# Patient Record
Sex: Male | Born: 1955 | Race: White | Hispanic: No | Marital: Married | Smoking: Never smoker
Health system: Southern US, Community
[De-identification: ages and names within clinical notes are randomized; demographics above are authoritative.]

## PROBLEM LIST (undated history)

## (undated) DIAGNOSIS — I2510 Atherosclerotic heart disease of native coronary artery without angina pectoris: Secondary | ICD-10-CM

## (undated) DIAGNOSIS — E1140 Type 2 diabetes mellitus with diabetic neuropathy, unspecified: Secondary | ICD-10-CM

## (undated) DIAGNOSIS — J189 Pneumonia, unspecified organism: Secondary | ICD-10-CM

## (undated) DIAGNOSIS — E119 Type 2 diabetes mellitus without complications: Secondary | ICD-10-CM

## (undated) DIAGNOSIS — I509 Heart failure, unspecified: Secondary | ICD-10-CM

## (undated) DIAGNOSIS — I219 Acute myocardial infarction, unspecified: Secondary | ICD-10-CM

## (undated) DIAGNOSIS — E1121 Type 2 diabetes mellitus with diabetic nephropathy: Secondary | ICD-10-CM

## (undated) DIAGNOSIS — I10 Essential (primary) hypertension: Secondary | ICD-10-CM

## (undated) DIAGNOSIS — E1110 Type 2 diabetes mellitus with ketoacidosis without coma: Secondary | ICD-10-CM

## (undated) HISTORY — PX: KNEE ARTHROSCOPY: SHX127

## (undated) HISTORY — PX: APPENDECTOMY: SHX54

## (undated) HISTORY — PX: BACK SURGERY: SHX140

## (undated) HISTORY — PX: LUMBAR DISC SURGERY: SHX700

---

## 2000-02-20 HISTORY — PX: CORONARY ARTERY BYPASS GRAFT: SHX141

## 2013-02-19 HISTORY — PX: CORONARY ANGIOPLASTY WITH STENT PLACEMENT: SHX49

## 2015-01-20 HISTORY — PX: TOE AMPUTATION: SHX809

## 2017-10-20 DIAGNOSIS — E1110 Type 2 diabetes mellitus with ketoacidosis without coma: Secondary | ICD-10-CM

## 2017-10-20 HISTORY — DX: Type 2 diabetes mellitus with ketoacidosis without coma: E11.10

## 2017-11-07 ENCOUNTER — Other Ambulatory Visit: Payer: Self-pay

## 2017-11-07 ENCOUNTER — Encounter (HOSPITAL_BASED_OUTPATIENT_CLINIC_OR_DEPARTMENT_OTHER): Payer: Self-pay | Admitting: *Deleted

## 2017-11-07 ENCOUNTER — Inpatient Hospital Stay (HOSPITAL_COMMUNITY): Payer: BLUE CROSS/BLUE SHIELD

## 2017-11-07 ENCOUNTER — Emergency Department (HOSPITAL_BASED_OUTPATIENT_CLINIC_OR_DEPARTMENT_OTHER): Payer: BLUE CROSS/BLUE SHIELD

## 2017-11-07 ENCOUNTER — Inpatient Hospital Stay (HOSPITAL_BASED_OUTPATIENT_CLINIC_OR_DEPARTMENT_OTHER)
Admission: EM | Admit: 2017-11-07 | Discharge: 2017-11-16 | DRG: 208 | Disposition: A | Discharge status: Home / Self Care | Payer: BLUE CROSS/BLUE SHIELD | Attending: Internal Medicine | Admitting: Internal Medicine

## 2017-11-07 DIAGNOSIS — I340 Nonrheumatic mitral (valve) insufficiency: Secondary | ICD-10-CM | POA: Diagnosis not present

## 2017-11-07 DIAGNOSIS — I2510 Atherosclerotic heart disease of native coronary artery without angina pectoris: Secondary | ICD-10-CM | POA: Diagnosis present

## 2017-11-07 DIAGNOSIS — J9600 Acute respiratory failure, unspecified whether with hypoxia or hypercapnia: Secondary | ICD-10-CM | POA: Diagnosis not present

## 2017-11-07 DIAGNOSIS — E785 Hyperlipidemia, unspecified: Secondary | ICD-10-CM | POA: Diagnosis present

## 2017-11-07 DIAGNOSIS — J9601 Acute respiratory failure with hypoxia: Secondary | ICD-10-CM | POA: Diagnosis present

## 2017-11-07 DIAGNOSIS — Z7902 Long term (current) use of antithrombotics/antiplatelets: Secondary | ICD-10-CM

## 2017-11-07 DIAGNOSIS — I21A1 Myocardial infarction type 2: Secondary | ICD-10-CM | POA: Diagnosis present

## 2017-11-07 DIAGNOSIS — Z794 Long term (current) use of insulin: Secondary | ICD-10-CM

## 2017-11-07 DIAGNOSIS — E10649 Type 1 diabetes mellitus with hypoglycemia without coma: Secondary | ICD-10-CM | POA: Diagnosis not present

## 2017-11-07 DIAGNOSIS — J9690 Respiratory failure, unspecified, unspecified whether with hypoxia or hypercapnia: Secondary | ICD-10-CM

## 2017-11-07 DIAGNOSIS — Z23 Encounter for immunization: Secondary | ICD-10-CM | POA: Diagnosis not present

## 2017-11-07 DIAGNOSIS — E1021 Type 1 diabetes mellitus with diabetic nephropathy: Secondary | ICD-10-CM | POA: Diagnosis present

## 2017-11-07 DIAGNOSIS — I5033 Acute on chronic diastolic (congestive) heart failure: Secondary | ICD-10-CM | POA: Diagnosis present

## 2017-11-07 DIAGNOSIS — J90 Pleural effusion, not elsewhere classified: Secondary | ICD-10-CM | POA: Diagnosis not present

## 2017-11-07 DIAGNOSIS — I25810 Atherosclerosis of coronary artery bypass graft(s) without angina pectoris: Secondary | ICD-10-CM | POA: Diagnosis present

## 2017-11-07 DIAGNOSIS — Z978 Presence of other specified devices: Secondary | ICD-10-CM

## 2017-11-07 DIAGNOSIS — J918 Pleural effusion in other conditions classified elsewhere: Secondary | ICD-10-CM | POA: Diagnosis present

## 2017-11-07 DIAGNOSIS — E1310 Other specified diabetes mellitus with ketoacidosis without coma: Secondary | ICD-10-CM | POA: Diagnosis not present

## 2017-11-07 DIAGNOSIS — E1040 Type 1 diabetes mellitus with diabetic neuropathy, unspecified: Secondary | ICD-10-CM | POA: Diagnosis present

## 2017-11-07 DIAGNOSIS — Z7982 Long term (current) use of aspirin: Secondary | ICD-10-CM

## 2017-11-07 DIAGNOSIS — Z955 Presence of coronary angioplasty implant and graft: Secondary | ICD-10-CM

## 2017-11-07 DIAGNOSIS — R0602 Shortness of breath: Secondary | ICD-10-CM

## 2017-11-07 DIAGNOSIS — E0810 Diabetes mellitus due to underlying condition with ketoacidosis without coma: Secondary | ICD-10-CM | POA: Diagnosis not present

## 2017-11-07 DIAGNOSIS — E876 Hypokalemia: Secondary | ICD-10-CM | POA: Diagnosis not present

## 2017-11-07 DIAGNOSIS — N179 Acute kidney failure, unspecified: Secondary | ICD-10-CM | POA: Diagnosis present

## 2017-11-07 DIAGNOSIS — E1010 Type 1 diabetes mellitus with ketoacidosis without coma: Secondary | ICD-10-CM | POA: Diagnosis present

## 2017-11-07 DIAGNOSIS — I110 Hypertensive heart disease with heart failure: Secondary | ICD-10-CM | POA: Diagnosis present

## 2017-11-07 DIAGNOSIS — I249 Acute ischemic heart disease, unspecified: Secondary | ICD-10-CM

## 2017-11-07 DIAGNOSIS — J810 Acute pulmonary edema: Secondary | ICD-10-CM | POA: Diagnosis not present

## 2017-11-07 DIAGNOSIS — E872 Acidosis: Secondary | ICD-10-CM | POA: Diagnosis not present

## 2017-11-07 DIAGNOSIS — R0600 Dyspnea, unspecified: Secondary | ICD-10-CM

## 2017-11-07 DIAGNOSIS — E1110 Type 2 diabetes mellitus with ketoacidosis without coma: Secondary | ICD-10-CM | POA: Diagnosis present

## 2017-11-07 DIAGNOSIS — I214 Non-ST elevation (NSTEMI) myocardial infarction: Secondary | ICD-10-CM | POA: Diagnosis not present

## 2017-11-07 DIAGNOSIS — I10 Essential (primary) hypertension: Secondary | ICD-10-CM | POA: Diagnosis not present

## 2017-11-07 DIAGNOSIS — I739 Peripheral vascular disease, unspecified: Secondary | ICD-10-CM | POA: Diagnosis not present

## 2017-11-07 DIAGNOSIS — I219 Acute myocardial infarction, unspecified: Secondary | ICD-10-CM

## 2017-11-07 HISTORY — DX: Heart failure, unspecified: I50.9

## 2017-11-07 HISTORY — DX: Atherosclerotic heart disease of native coronary artery without angina pectoris: I25.10

## 2017-11-07 HISTORY — DX: Acute myocardial infarction, unspecified: I21.9

## 2017-11-07 HISTORY — DX: Type 2 diabetes mellitus with diabetic neuropathy, unspecified: E11.40

## 2017-11-07 HISTORY — DX: Essential (primary) hypertension: I10

## 2017-11-07 HISTORY — DX: Type 2 diabetes mellitus with diabetic nephropathy: E11.21

## 2017-11-07 HISTORY — DX: Type 2 diabetes mellitus with ketoacidosis without coma: E11.10

## 2017-11-07 HISTORY — DX: Pneumonia, unspecified organism: J18.9

## 2017-11-07 HISTORY — DX: Type 2 diabetes mellitus without complications: E11.9

## 2017-11-07 LAB — CBC WITH DIFFERENTIAL/PLATELET
Basophils Absolute: 0 10*3/uL (ref 0.0–0.1)
Basophils Relative: 0 %
Eosinophils Absolute: 0.1 10*3/uL (ref 0.0–0.7)
Eosinophils Relative: 1 %
HCT: 44.2 % (ref 39.0–52.0)
Hemoglobin: 15.3 g/dL (ref 13.0–17.0)
LYMPHS PCT: 6 %
Lymphs Abs: 0.8 10*3/uL (ref 0.7–4.0)
MCH: 30.8 pg (ref 26.0–34.0)
MCHC: 34.6 g/dL (ref 30.0–36.0)
MCV: 88.9 fL (ref 78.0–100.0)
MONO ABS: 1.2 10*3/uL — AB (ref 0.1–1.0)
MONOS PCT: 9 %
Neutro Abs: 11.5 10*3/uL — ABNORMAL HIGH (ref 1.7–7.7)
Neutrophils Relative %: 84 %
Platelets: 436 10*3/uL — ABNORMAL HIGH (ref 150–400)
RBC: 4.97 MIL/uL (ref 4.22–5.81)
RDW: 12.9 % (ref 11.5–15.5)
WBC: 13.7 10*3/uL — ABNORMAL HIGH (ref 4.0–10.5)

## 2017-11-07 LAB — TROPONIN I
TROPONIN I: 0.59 ng/mL — AB (ref <0.03)
Troponin I: 1.77 ng/mL (ref <0.03)

## 2017-11-07 LAB — TRIGLYCERIDES: Triglycerides: 81 mg/dL (ref <150)

## 2017-11-07 LAB — ECHOCARDIOGRAM COMPLETE
HEIGHTINCHES: 70 in
Weight: 2892.44 oz

## 2017-11-07 LAB — POCT I-STAT 3, ART BLOOD GAS (G3+)
ACID-BASE DEFICIT: 7 mmol/L — AB (ref 0.0–2.0)
BICARBONATE: 17.1 mmol/L — AB (ref 20.0–28.0)
O2 Saturation: 100 %
PCO2 ART: 29.8 mmHg — AB (ref 32.0–48.0)
PO2 ART: 342 mmHg — AB (ref 83.0–108.0)
Patient temperature: 97.5
TCO2: 18 mmol/L — ABNORMAL LOW (ref 22–32)
pH, Arterial: 7.362 (ref 7.350–7.450)

## 2017-11-07 LAB — GLUCOSE, CAPILLARY
GLUCOSE-CAPILLARY: 156 mg/dL — AB (ref 70–99)
GLUCOSE-CAPILLARY: 231 mg/dL — AB (ref 70–99)
GLUCOSE-CAPILLARY: 268 mg/dL — AB (ref 70–99)
Glucose-Capillary: 317 mg/dL — ABNORMAL HIGH (ref 70–99)
Glucose-Capillary: 215 mg/dL — ABNORMAL HIGH (ref 70–99)
Glucose-Capillary: 166 mg/dL — ABNORMAL HIGH (ref 70–99)
Glucose-Capillary: 133 mg/dL — ABNORMAL HIGH (ref 70–99)
Glucose-Capillary: 140 mg/dL — ABNORMAL HIGH (ref 70–99)

## 2017-11-07 LAB — COMPREHENSIVE METABOLIC PANEL
ALT: 16 U/L (ref 0–44)
AST: 20 U/L (ref 15–41)
Albumin: 3.3 g/dL — ABNORMAL LOW (ref 3.5–5.0)
Alkaline Phosphatase: 82 U/L (ref 38–126)
Anion gap: 26 — ABNORMAL HIGH (ref 5–15)
BUN: 46 mg/dL — ABNORMAL HIGH (ref 8–23)
CHLORIDE: 95 mmol/L — AB (ref 98–111)
CO2: 15 mmol/L — ABNORMAL LOW (ref 22–32)
Calcium: 8.8 mg/dL — ABNORMAL LOW (ref 8.9–10.3)
Creatinine, Ser: 1.41 mg/dL — ABNORMAL HIGH (ref 0.61–1.24)
GFR calc Af Amer: >60 mL/min (ref >60)
GFR, EST NON AFRICAN AMERICAN: 52 mL/min — AB (ref >60)
GLUCOSE: 302 mg/dL — AB (ref 70–99)
POTASSIUM: 3.9 mmol/L (ref 3.5–5.1)
Sodium: 136 mmol/L (ref 135–145)
Total Bilirubin: 1.3 mg/dL — ABNORMAL HIGH (ref 0.3–1.2)
Total Protein: 7.9 g/dL (ref 6.5–8.1)

## 2017-11-07 LAB — BASIC METABOLIC PANEL
ANION GAP: 23 — AB (ref 5–15)
ANION GAP: 14 (ref 5–15)
BUN: 42 mg/dL — AB (ref 8–23)
BUN: 43 mg/dL — ABNORMAL HIGH (ref 8–23)
CHLORIDE: 99 mmol/L (ref 98–111)
CHLORIDE: 103 mmol/L (ref 98–111)
CO2: 11 mmol/L — ABNORMAL LOW (ref 22–32)
CO2: 18 mmol/L — ABNORMAL LOW (ref 22–32)
Calcium: 8.2 mg/dL — ABNORMAL LOW (ref 8.9–10.3)
Calcium: 7.9 mg/dL — ABNORMAL LOW (ref 8.9–10.3)
Creatinine, Ser: 1.59 mg/dL — ABNORMAL HIGH (ref 0.61–1.24)
Creatinine, Ser: 1.29 mg/dL — ABNORMAL HIGH (ref 0.61–1.24)
GFR calc Af Amer: 52 mL/min — ABNORMAL LOW (ref >60)
GFR calc Af Amer: >60 mL/min (ref >60)
GFR, EST NON AFRICAN AMERICAN: 45 mL/min — AB (ref >60)
GFR, EST NON AFRICAN AMERICAN: 58 mL/min — AB (ref >60)
GLUCOSE: 298 mg/dL — AB (ref 70–99)
Glucose, Bld: 203 mg/dL — ABNORMAL HIGH (ref 70–99)
POTASSIUM: 3.6 mmol/L (ref 3.5–5.1)
Potassium: 3.9 mmol/L (ref 3.5–5.1)
SODIUM: 133 mmol/L — AB (ref 135–145)
SODIUM: 135 mmol/L (ref 135–145)

## 2017-11-07 LAB — PROCALCITONIN: Procalcitonin: 0.32 ng/mL

## 2017-11-07 LAB — RAPID URINE DRUG SCREEN, HOSP PERFORMED
Amphetamines: NOT DETECTED
BENZODIAZEPINES: NOT DETECTED
Barbiturates: NOT DETECTED
Cocaine: NOT DETECTED
OPIATES: NOT DETECTED
TETRAHYDROCANNABINOL: NOT DETECTED

## 2017-11-07 LAB — I-STAT CG4 LACTIC ACID, ED: LACTIC ACID, VENOUS: 4.39 mmol/L — AB (ref 0.5–1.9)

## 2017-11-07 LAB — I-STAT VENOUS BLOOD GAS, ED
Acid-base deficit: 12 mmol/L — ABNORMAL HIGH (ref 0.0–2.0)
Bicarbonate: 14.1 mmol/L — ABNORMAL LOW (ref 20.0–28.0)
O2 Saturation: 53 %
PCO2 VEN: 32.2 mmHg — AB (ref 44.0–60.0)
Patient temperature: 97.5
TCO2: 15 mmol/L — ABNORMAL LOW (ref 22–32)
pH, Ven: 7.247 — ABNORMAL LOW (ref 7.250–7.430)
pO2, Ven: 31 mmHg — CL (ref 32.0–45.0)

## 2017-11-07 LAB — URINALYSIS, ROUTINE W REFLEX MICROSCOPIC
BILIRUBIN URINE: NEGATIVE
HGB URINE DIPSTICK: NEGATIVE
Ketones, ur: 80 mg/dL — AB
Leukocytes, UA: NEGATIVE
NITRITE: NEGATIVE
PROTEIN: NEGATIVE mg/dL
Specific Gravity, Urine: 1.021 (ref 1.005–1.030)
pH: 6 (ref 5.0–8.0)

## 2017-11-07 LAB — BRAIN NATRIURETIC PEPTIDE: B Natriuretic Peptide: 129.3 pg/mL — ABNORMAL HIGH (ref 0.0–100.0)

## 2017-11-07 LAB — CBG MONITORING, ED
GLUCOSE-CAPILLARY: 324 mg/dL — AB (ref 70–99)
GLUCOSE-CAPILLARY: 305 mg/dL — AB (ref 70–99)
Glucose-Capillary: 302 mg/dL — ABNORMAL HIGH (ref 70–99)

## 2017-11-07 LAB — LACTIC ACID, PLASMA
LACTIC ACID, VENOUS: 1.5 mmol/L (ref 0.5–1.9)
Lactic Acid, Venous: 1.2 mmol/L (ref 0.5–1.9)

## 2017-11-07 LAB — BETA-HYDROXYBUTYRIC ACID: BETA-HYDROXYBUTYRIC ACID: 7.7 mmol/L — AB (ref 0.05–0.27)

## 2017-11-07 LAB — STREP PNEUMONIAE URINARY ANTIGEN: STREP PNEUMO URINARY ANTIGEN: NEGATIVE

## 2017-11-07 LAB — MRSA PCR SCREENING: MRSA BY PCR: NEGATIVE

## 2017-11-07 LAB — LIPASE, BLOOD: Lipase: 50 U/L (ref 11–51)

## 2017-11-07 MED ORDER — SODIUM CHLORIDE 0.9 % IV SOLN: 2.0000 g | Freq: Once | INTRAVENOUS | Status: DC

## 2017-11-07 MED ORDER — FENTANYL CITRATE (PF) 100 MCG/2ML IJ SOLN
100.0000 ug | INTRAMUSCULAR | Status: DC | PRN
  Administered 2017-11-07: 100 ug via INTRAVENOUS
  Filled 2017-11-07 (×2): qty 2

## 2017-11-07 MED ORDER — IPRATROPIUM-ALBUTEROL 0.5-2.5 (3) MG/3ML IN SOLN
3.0000 mL | Freq: Once | RESPIRATORY_TRACT | Status: AC
  Administered 2017-11-07: 3 mL via RESPIRATORY_TRACT

## 2017-11-07 MED ORDER — FENTANYL CITRATE (PF) 100 MCG/2ML IJ SOLN
100.0000 ug | INTRAMUSCULAR | Status: DC | PRN
  Administered 2017-11-07 – 2017-11-08 (×4): 100 ug via INTRAVENOUS
  Filled 2017-11-07 (×2): qty 2

## 2017-11-07 MED ORDER — ORAL CARE MOUTH RINSE
15.0000 mL | OROMUCOSAL | Status: DC
  Administered 2017-11-08 – 2017-11-09 (×13): 15 mL via OROMUCOSAL

## 2017-11-07 MED ORDER — MIDAZOLAM HCL 2 MG/2ML IJ SOLN
2.0000 mg | Freq: Once | INTRAMUSCULAR | Status: AC
  Administered 2017-11-07: 2 mg via INTRAVENOUS

## 2017-11-07 MED ORDER — MIDAZOLAM HCL 2 MG/2ML IJ SOLN
INTRAMUSCULAR | Status: AC
  Filled 2017-11-07: qty 2

## 2017-11-07 MED ORDER — BISACODYL 10 MG RE SUPP: 10.0000 mg | Freq: Every day | RECTAL | Status: DC | PRN

## 2017-11-07 MED ORDER — FAMOTIDINE IN NACL 20-0.9 MG/50ML-% IV SOLN
20.0000 mg | INTRAVENOUS | Status: DC
  Administered 2017-11-07: 20 mg via INTRAVENOUS
  Filled 2017-11-07: qty 50

## 2017-11-07 MED ORDER — ETOMIDATE 2 MG/ML IV SOLN
0.3000 mg/kg | Freq: Once | INTRAVENOUS | Status: AC
  Administered 2017-11-07: 24.6 mg via INTRAVENOUS

## 2017-11-07 MED ORDER — SODIUM CHLORIDE 0.9 % IV BOLUS
1000.0000 mL | Freq: Once | INTRAVENOUS | Status: AC
  Administered 2017-11-07: 1000 mL via INTRAVENOUS

## 2017-11-07 MED ORDER — DEXTROSE-NACL 5-0.45 % IV SOLN
INTRAVENOUS | Status: DC
  Administered 2017-11-07: 20:00:00 via INTRAVENOUS

## 2017-11-07 MED ORDER — SODIUM CHLORIDE 0.9 % IV SOLN: Freq: Once | INTRAVENOUS | Status: DC

## 2017-11-07 MED ORDER — SODIUM CHLORIDE 0.9 % IV SOLN
INTRAVENOUS | Status: DC
  Administered 2017-11-07: 15:00:00 via INTRAVENOUS

## 2017-11-07 MED ORDER — ASPIRIN 300 MG RE SUPP
150.0000 mg | Freq: Every day | RECTAL | Status: DC
  Administered 2017-11-07 – 2017-11-08 (×2): 150 mg via RECTAL
  Filled 2017-11-07: qty 1

## 2017-11-07 MED ORDER — CHLORHEXIDINE GLUCONATE 0.12% ORAL RINSE (MEDLINE KIT)
15.0000 mL | Freq: Two times a day (BID) | OROMUCOSAL | Status: DC
  Administered 2017-11-07 – 2017-11-09 (×4): 15 mL via OROMUCOSAL

## 2017-11-07 MED ORDER — HEPARIN (PORCINE) IN NACL 100-0.45 UNIT/ML-% IJ SOLN
1900.0000 [IU]/h | INTRAMUSCULAR | Status: DC
  Administered 2017-11-07: 950 [IU]/h via INTRAVENOUS
  Administered 2017-11-08: 1550 [IU]/h via INTRAVENOUS
  Administered 2017-11-09: 1700 [IU]/h via INTRAVENOUS
  Filled 2017-11-07 (×3): qty 250

## 2017-11-07 MED ORDER — FENTANYL CITRATE (PF) 100 MCG/2ML IJ SOLN
100.0000 ug | Freq: Once | INTRAMUSCULAR | Status: AC
  Administered 2017-11-07: 100 ug via INTRAVENOUS
  Filled 2017-11-07: qty 2

## 2017-11-07 MED ORDER — PROPOFOL 1000 MG/100ML IV EMUL
0.0000 ug/kg/min | INTRAVENOUS | Status: DC
  Administered 2017-11-07: 10 ug/kg/min via INTRAVENOUS
  Administered 2017-11-08: 15 ug/kg/min via INTRAVENOUS
  Administered 2017-11-08: 20 ug/kg/min via INTRAVENOUS
  Filled 2017-11-07 (×3): qty 100

## 2017-11-07 MED ORDER — DOCUSATE SODIUM 50 MG/5ML PO LIQD
100.0000 mg | Freq: Two times a day (BID) | ORAL | Status: DC | PRN
  Filled 2017-11-07: qty 10

## 2017-11-07 MED ORDER — HEPARIN BOLUS VIA INFUSION
4000.0000 [IU] | Freq: Once | INTRAVENOUS | Status: AC
  Administered 2017-11-07: 4000 [IU] via INTRAVENOUS
  Filled 2017-11-07: qty 4000

## 2017-11-07 MED ORDER — POTASSIUM CHLORIDE 10 MEQ/100ML IV SOLN
10.0000 meq | INTRAVENOUS | Status: DC
  Administered 2017-11-07: 10 meq via INTRAVENOUS
  Filled 2017-11-07: qty 100

## 2017-11-07 MED ORDER — SODIUM CHLORIDE 0.9 % IV SOLN
3.0000 g | Freq: Once | INTRAVENOUS | Status: AC
  Administered 2017-11-07: 3 g via INTRAVENOUS
  Filled 2017-11-07: qty 3

## 2017-11-07 MED ORDER — ONDANSETRON HCL 4 MG/2ML IJ SOLN
4.0000 mg | Freq: Once | INTRAMUSCULAR | Status: AC
  Administered 2017-11-07: 4 mg via INTRAVENOUS
  Filled 2017-11-07: qty 2

## 2017-11-07 MED ORDER — POTASSIUM CHLORIDE 10 MEQ/100ML IV SOLN
10.0000 meq | INTRAVENOUS | Status: AC
  Administered 2017-11-07 (×2): 10 meq via INTRAVENOUS
  Filled 2017-11-07 (×2): qty 100

## 2017-11-07 MED ORDER — SODIUM CHLORIDE 0.9 % IV SOLN
INTRAVENOUS | Status: DC
  Administered 2017-11-07: 2.6 [IU]/h via INTRAVENOUS
  Filled 2017-11-07 (×2): qty 1

## 2017-11-07 MED ORDER — FENTANYL CITRATE (PF) 100 MCG/2ML IJ SOLN
INTRAMUSCULAR | Status: AC
  Administered 2017-11-07: 100 ug via INTRAVENOUS
  Filled 2017-11-07: qty 2

## 2017-11-07 MED ORDER — SODIUM CHLORIDE 0.9 % IV SOLN
INTRAVENOUS | Status: DC
  Administered 2017-11-07 – 2017-11-08 (×2): 4.2 [IU]/h via INTRAVENOUS
  Filled 2017-11-07 (×2): qty 1

## 2017-11-07 NOTE — Progress Notes (Signed)
CRITICAL VALUE ALERT  Critical Value: Troponin   Date & Time Notied: 11/07/2017 22:00  Provider Notified: Pola Corn MD  Orders Received/Actions taken: Will monitor.

## 2017-11-07 NOTE — ED Notes (Signed)
Insulin gtt adjusted per Glucostabilizer protocol

## 2017-11-07 NOTE — Procedures (Signed)
Central Venous Catheter Insertion Procedure Note Aidyn Calkin [id number] 1955/11/20  Procedure: Insertion of Central Venous Catheter Indications: Assessment of intravascular volume, Drug and/or fluid administration and Frequent blood sampling  Procedure Details Consent: Risks of procedure as well as the alternatives and risks of each were explained to the (patient/caregiver).  Consent for procedure obtained. Time Out: Verified patient identification, verified procedure, site/side was marked, verified correct patient position, special equipment/implants available, medications/allergies/relevent history reviewed, required imaging and test results available.  Performed  Maximum sterile technique was used including antiseptics, cap, gloves, gown, hand hygiene, mask and sheet. Skin prep: Chlorhexidine; local anesthetic administered A antimicrobial bonded/coated triple lumen catheter was placed in the left subclavian vein using the Seldinger technique.  Evaluation Blood flow good Complications: No apparent complications Patient did tolerate procedure well. Chest X-ray ordered to verify placement.  CXR: pending.  Steffie Waggoner 11/07/2017, 3:57 PM

## 2017-11-07 NOTE — Progress Notes (Signed)
RT reported Lactic acid of 4.39 DR Curatolo.

## 2017-11-07 NOTE — ED Notes (Signed)
Attempted x 3 to reach rec unit, unable to reach any RN on that unit, no answer to phone calls. [facility] aware that phone report was attempted.

## 2017-11-07 NOTE — ED Triage Notes (Signed)
Pt presents with shortness of breath and having N/V with poor appetite for the past 5 days

## 2017-11-07 NOTE — Progress Notes (Signed)
  Echocardiogram 2D Echocardiogram has been performed.  Delcie Roch 11/07/2017, 3:23 PM

## 2017-11-07 NOTE — ED Notes (Signed)
Insulin gtt and K IVPB initiated per EDP orders. Pt appears more comfortable on BiPAP device at 14/5 x 9 w/ FiO2 at 64%

## 2017-11-07 NOTE — ED Notes (Signed)
ED Provider at bedside. 

## 2017-11-07 NOTE — Consult Note (Signed)
Cardiology Consultation:   Patient ID: Brian Miller MRN: [id number]; DOB: May 20, 1955  Admit date: 11/07/2017 Date of Consult: 11/07/2017  Primary Care Provider: Jolene Provost, MD Primary Cardiologist: No primary care provider on file. [place] ([facility]) Primary Electrophysiologist:  None   Patient Profile:   Brian Miller is a 62 y.o. male with a hx of CAD s/p CABG (2002) and PCI (2015) who is being seen today for the evaluation of respiratory distress at the request of Dr. Molli Knock.  History of Present Illness:   Mr. Bruemmer has a long-standing history of coronary artery disease that has never manifested with angina.  In 2002 a treadmill stress test performed for monitoring of diabetes mellitus led to bypass surgery.  In 2015 he presented with GI symptoms (nausea and vomiting) and had PCI to LCX (occluded SVG-OM, patent LIMA to LAD, normal LVEF).  He has a history of long-standing type II diabetes mellitus requiring treatment with insulin (hemoglobin A1c 7.0% range) and has diabetic nephropathy, peripheral neuropathy, PAD, history of foot osteomyelitis, hyperlipidemia on statin, systemic hypertension.  He presented today to med [facility] with nausea, vomiting, diarrhea, 2 days of cough and dyspnea and was found to have DKA.  While receiving intravenous fluids he developed acute hypoxic respiratory failure and he is now receiving BiPAP.  He is mildly tachycardic, but is not hypotensive.  Plan creatinine is normal at 0.9, today 1.41.  BNP is 129.  Lactic acid is 4.4.  His chest x-ray shows bilateral infiltrates that are however very asymmetric with predominant right-sided involvement and a loculated right-sided pleural effusion in the minor fissure.  Electrocardiogram shows sinus tachycardia with prominent ST segment depression and T wave inversion across the anterior precordial leads.  There is relatively early RS transition suggesting of a posterior infarction.  No old  ECGs are available for parison.  Past Medical History:  Diagnosis Date  . Diabetes mellitus without complication [facility])     Past Surgical History:  Procedure Laterality Date  . CARDIAC SURGERY  2002     Home Medications:  Prior to Admission medications   Medication Sig Start Date End Date Taking? Authorizing Provider  aspirin 81 MG chewable tablet Chew 81 mg by mouth daily.   Yes [provider]  insulin glargine (LANTUS) 100 UNIT/ML injection Inject 80 Units into the skin daily.   Yes [provider]  metFORMIN (GLUCOPHAGE) 1000 MG tablet Take 1,000 mg by mouth 2 (two) times daily with a meal.   Yes [provider]  metoprolol succinate (TOPROL-XL) 25 MG 24 hr tablet Take 25 mg by mouth daily.   Yes [provider]    Inpatient Medications: Scheduled Meds: . aspirin  150 mg Rectal Daily   Continuous Infusions: . sodium chloride    . ampicillin-sulbactam (UNASYN) IV    . dextrose 5 % and 0.45% NaCl    . famotidine (PEPCID) IV    . insulin (NOVOLIN-R) infusion    . potassium chloride     PRN Meds:   Allergies:   No Known Allergies  Social History:   Social History   Socioeconomic History  . Marital status: Married    Spouse name: Not on file  . Number of children: Not on file  . Years of education: Not on file  . Highest education level: Not on file  Occupational History  . Not on file  Social Needs  . Financial resource strain: Not on file  . Food insecurity:    Worry:  Not on file    Inability: Not on file  . Transportation needs:    Medical: Not on file    Non-medical: Not on file  Tobacco Use  . Smoking status: Never Smoker  . Smokeless tobacco: Former Engineer, water and Sexual Activity  . Alcohol use: Not Currently  . Drug use: Never  . Sexual activity: Not on file  Lifestyle  . Physical activity:    Days per week: Not on file    Minutes per session: Not on file  . Stress: Not on file  Relationships  . Social  connections:    Talks on phone: Not on file    Gets together: Not on file    Attends religious service: Not on file    Active member of club or organization: Not on file    Attends meetings of clubs or organizations: Not on file    Relationship status: Not on file  . Intimate partner violence:    Fear of current or ex partner: Not on file    Emotionally abused: Not on file    Physically abused: Not on file    Forced sexual activity: Not on file  Other Topics Concern  . Not on file  Social History Narrative  . Not on file    Family History:   Reviewed briefly with patient.  No pertinent history of premature cardiac illness or sudden cardiac death.  ROS:  Please see the history of present illness.  All other ROS reviewed and negative.     Physical Exam/Data:   Vitals:   11/07/17 1122 11/07/17 1156 11/07/17 1209 11/07/17 1300  BP:  (!) 141/76 (!) 152/81   Pulse:  (!) 110 (!) 109   Resp:   (!) 38   Temp:      TempSrc:      SpO2: 100% (!) 45% 99%   Weight:    82 kg  Height:    5\' 10"  (1.778 m)    Intake/Output Summary (Last 24 hours) at 11/07/2017 1424 Last data filed at 11/07/2017 1204 Gross per 24 hour  Intake 1095 ml  Output -  Net 1095 ml   Filed Weights   11/07/17 1300  Weight: 82 kg   Body mass index is 25.94 kg/m.  General:  Well nourished, well developed, in mild respiratory distress, wearing BiPAP HEENT: normal Lymph: no adenopathy Neck: no JVD Endocrine:  No thryomegaly Vascular: No carotid bruits; FA pulses 2+ bilaterally without bruits  Cardiac:  normal S1, S2; RRR; no murmur Lungs:  clear to auscultation bilaterally, no wheezing, rhonchi or rales  Abd: soft, nontender, no hepatomegaly  Ext: no edema, amputation of right second toe Musculoskeletal:  No deformities, BUE and BLE strength normal and equal Skin: warm and dry  Neuro:  CNs 2-12 intact, no focal abnormalities noted Psych:  Normal affect   EKG:  The EKG was personally reviewed and  demonstrates: Sinus tachycardia, prominent ST segment depression and T wave inversion V2-V6, slightly worsened on the tracing from 1348 hrs. compared with 10:00 this morning. Telemetry:  Telemetry was personally reviewed and demonstrates: Sinus tachycardia  Relevant CV Studies: Bedside review of echo appears to show borderline decreased left ventricular systolic function with ejection fraction 50% with hypokinesis of the mid septum but with normal apical wall motion (suggestive of patent LIMA to LAD but possibly occluded native artery), mildly dilated right ventricle with normal left ventricular systolic function.  No obvious valvular abnormalities.  "Abnormal relaxation" mitral inflow pattern  suggests he is not in acute CHF. Full echo being performed now.  Laboratory Data:  Chemistry Recent Labs  Lab 11/07/17 0947  NA 136  K 3.9  CL 95*  CO2 15*  GLUCOSE 302*  BUN 46*  CREATININE 1.41*  CALCIUM 8.8*  GFRNONAA 52*  GFRAA >60  ANIONGAP 26*    Recent Labs  Lab 11/07/17 0947  PROT 7.9  ALBUMIN 3.3*  AST 20  ALT 16  ALKPHOS 82  BILITOT 1.3*   Hematology Recent Labs  Lab 11/07/17 0947  WBC 13.7*  RBC 4.97  HGB 15.3  HCT 44.2  MCV 88.9  MCH 30.8  MCHC 34.6  RDW 12.9  PLT 436*   Cardiac EnzymesNo results for input(s): TROPONINI in the last 168 hours. No results for input(s): TROPIPOC in the last 168 hours.  BNP Recent Labs  Lab 11/07/17 0947  BNP 129.3*    DDimer No results for input(s): DDIMER in the last 168 hours.  Radiology/Studies:  Dg Chest Portable 1 View  Result Date: 11/07/2017 CLINICAL DATA:  62 year old male with shortness of breath for 5 days. Nausea vomiting. EXAM: PORTABLE CHEST 1 VIEW COMPARISON:  [facility] portable chest x-ray 02/07/2015 and earlier. FINDINGS: Portable AP semi upright view at 1050 hours. Stable sequelae of median sternotomy. Cardiac and mediastinal contours are within normal limits. Visualized tracheal air column  is within normal limits. Mixed veiling and confluent opacity in the right lung appears at least in part due to loculated pleural effusion. Streaky opacity at the left lung base more resembling atelectasis. No superimposed pneumothorax. Pulmonary vascularity appears within normal limits. Negative visible bowel gas pattern. No acute osseous abnormality identified. IMPRESSION: 1. Abnormal right lung opacity, new since 2016, appears at least in part related to loculated pleural effusion. 2. Left lung base atelectasis or scarring suspected. Electronically Signed   By: Odessa Fleming M.D.   On: 11/07/2017 11:20    Assessment and Plan:   1. CHF: The lung infiltrates are markedly asymmetric, I think, with primary involvement of the right lung suggesting aspiration or infectious pneumonia.  However there is some degree of bilateral vascular congestion.  Will review full echo for evidence of elevated filling pressure.  The BNP is minimally abnormal and there are no obvious clinical findings to suggest hypervolemia. Echo suggests low mean left atrial pressures. I think he can receive additional IVF if necessary and that his hypoxemia is not due to HF. 2. CAD s/p CABG s/p LCX-PCI: He has not complained of chest discomfort and his cardiac enzymes are still pending, but there are prominent signs of ischemia on the ECG (no old tracings are available for review).  Note that he never had angina with previous acute coronary events. Overall, I think it is most likely that he has demand ischemia (tachycardia, acidosis, DKA, etc). Old records would be very valuable. 3. Acute respiratory failure with hypoxia: consider aspiration pneumonia, R lung. 4. DKA: Trigger for DKA is uncertain and its possible that myocardial ischemia is the initial abnormality, but in my opinion it's more likely that he has a pulmonary infection. 5. PAD s/p toe amputation  6. AKI: In the setting of DKA. Expect improvement with hydration.      For questions  or updates, please contact [facility] Please consult [email or web address] for contact info under     Signed, Thurmon Fair, MD  11/07/2017 2:24 PM

## 2017-11-07 NOTE — Procedures (Signed)
Intubation Procedure Note Brian Miller [id number] 08-15-1955  Procedure: Intubation Indications: Respiratory insufficiency  Procedure Details Consent: Risks of procedure as well as the alternatives and risks of each were explained to the (patient/caregiver).  Consent for procedure obtained. Time Out: Verified patient identification, verified procedure, site/side was marked, verified correct patient position, special equipment/implants available, medications/allergies/relevent history reviewed, required imaging and test results available.  Performed  Maximum sterile technique was used including gloves, hand hygiene and mask.  MAC    Evaluation Hemodynamic Status: BP stable throughout; O2 sats: stable throughout Patient's Current Condition: stable Complications: No apparent complications Patient did tolerate procedure well. Chest X-ray ordered to verify placement.  CXR: pending.   Brian Miller 11/07/2017

## 2017-11-07 NOTE — Progress Notes (Signed)
ANTICOAGULATION CONSULT NOTE - Initial Consult  Pharmacy Consult for heparin Indication: chest pain/ACS  No Known Allergies  Patient Measurements: Height: 5\' 10"  (177.8 cm) Weight: 180 lb 12.4 oz (82 kg) IBW/kg (Calculated) : 73 Heparin Dosing Weight: 82 kg  Vital Signs: Temp: 97.5 F (36.4 C) (09/19 1107) Temp Source: Oral (09/19 0942) BP: 152/81 (09/19 1209) Pulse Rate: 109 (09/19 1209)  Labs: Recent Labs    11/07/17 0947  HGB 15.3  HCT 44.2  PLT 436*  CREATININE 1.41*    Estimated Creatinine Clearance: 56.8 mL/min (A) (by C-G formula based on SCr of 1.41 mg/dL (H)).  Assessment: CC/HPI: 62 yo m presenting with N/V/D and DKA  PMH: DM, CABG  Anticoag: none pta - iv hep for r/o ACS   Renal: SCr 1.41  Heme/Onc: H&H 15.3/44.2, Plt 436  Goal of Therapy:  Heparin level 0.3-0.7 units/ml Monitor platelets by anticoagulation protocol: Yes   Plan:  Heparin bolus 4000 units x 1 Heparin gtt 950 units/hr Initial lvl 2200  Daily HL CBC  Isaac Bliss, PharmD, BCPS, BCCCP Clinical Pharmacist [phone]  Please check AMION for all [facility] numbers  11/07/2017 2:41 PM

## 2017-11-07 NOTE — ED Notes (Signed)
Pt very tachycardic, dyspneic, having strong non-prod cough. EDP increased [place] to 6lpm. Asked RT to see pt regards to additional oxygen requirements secondary to POX at 88% on 4lp 

## 2017-11-07 NOTE — ED Notes (Signed)
Family at bedside. 

## 2017-11-07 NOTE — Progress Notes (Signed)
Attempted to get report from [facility] never able to get through - phone tree kept repeating no one to pick up phone - will get report from [facility]

## 2017-11-07 NOTE — ED Notes (Signed)
12 lead ECG performed and to EDP for review

## 2017-11-07 NOTE — Procedures (Signed)
OGT Placement By MD  OGT placed under direct laryngoscopy and confirmed by auscultation  Brian Miller G. Marissa Weaver, M.D. [facility]. Pager: [phone]. After hours pager: [phone]. 

## 2017-11-07 NOTE — ED Notes (Signed)
Pt immediately placed on cont cardiac monitoring, also placed on cont POX, CBG performed.

## 2017-11-07 NOTE — ED Notes (Signed)
Pt placed on 2 lpm via [place] 

## 2017-11-07 NOTE — ED Provider Notes (Addendum)
[facility] EMERGENCY DEPARTMENT Provider Note   CSN: [id number] Arrival date & time: 11/07/17  0931     History   Chief Complaint Chief Complaint  Patient presents with  . Shortness of Breath    HPI Brian Miller is a 62 y.o. male.  The history is provided by the patient.  Emesis   This is a new problem. The current episode started more than 2 days ago. The problem occurs 5 to 10 times per day. The problem has not changed since onset.The emesis has an appearance of stomach contents. There has been no fever. The fever has been present for less than 1 day. Associated symptoms include chills and diarrhea. Pertinent negatives include no abdominal pain, no arthralgias, no cough, no fever, no myalgias and no URI. Risk factors: hx of diabates on insulin     Past Medical History:  Diagnosis Date  . Diabetes mellitus without complication [facility])     Patient Active Problem List   Diagnosis Date Noted  . DKA (diabetic ketoacidoses) (HCC) 11/07/2017    Home Medications    Prior to Admission medications   Medication Sig Start Date End Date Taking? Authorizing Provider  aspirin 81 MG chewable tablet Chew 81 mg by mouth daily.   Yes [provider]  insulin glargine (LANTUS) 100 UNIT/ML injection Inject 80 Units into the skin daily.   Yes [provider]  metFORMIN (GLUCOPHAGE) 1000 MG tablet Take 1,000 mg by mouth 2 (two) times daily with a meal.   Yes [provider]  metoprolol succinate (TOPROL-XL) 25 MG 24 hr tablet Take 25 mg by mouth daily.   Yes [provider]    Family History No family history on file.  Social History Social History   Tobacco Use  . Smoking status: Never Smoker  . Smokeless tobacco: Former Engineer, water Use Topics  . Alcohol use: Not Currently  . Drug use: Never     Allergies   Patient has no known allergies.   Review of Systems Review of Systems  Constitutional: Positive for chills. Negative  for fever.  HENT: Negative for ear pain and sore throat.   Eyes: Negative for pain and visual disturbance.  Respiratory: Negative for cough and shortness of breath.   Cardiovascular: Negative for chest pain and palpitations.  Gastrointestinal: Positive for diarrhea and vomiting. Negative for abdominal pain.  Genitourinary: Negative for dysuria and hematuria.  Musculoskeletal: Negative for arthralgias, back pain and myalgias.  Skin: Negative for color change and rash.  Neurological: Negative for seizures and syncope.  All other systems reviewed and are negative.    Physical Exam Updated Vital Signs  ED Triage Vitals  Enc Vitals Group     BP 11/07/17 0941 (!) 153/72     Pulse Rate 11/07/17 0941 (!) 104     Resp 11/07/17 0943 (!) 28     Temp 11/07/17 0942 (!) 97.5 F (36.4 C)     Temp Source 11/07/17 0942 Oral     SpO2 11/07/17 0940 94 %     Weight --      Height --      Head Circumference --      Peak Flow --      Pain Score 11/07/17 0941 0     Pain Loc --      Pain Edu? --      Excl. in GC? --     Physical Exam  Constitutional: He appears well-developed and well-nourished. He appears distressed.  HENT:  Head: Normocephalic and atraumatic.  Dry mucous membranes   Eyes: Conjunctivae are normal.  Neck: Normal range of motion. Neck supple.  Cardiovascular: Normal rate, regular rhythm, normal heart sounds and intact distal pulses.  No murmur heard. Pulmonary/Chest: Effort normal and breath sounds normal. Tachypnea noted. No respiratory distress. He has no decreased breath sounds. He has no wheezes. He has no rhonchi. He has no rales.  Abdominal: Soft. There is no tenderness.  Musculoskeletal: He exhibits no edema.       Right lower leg: He exhibits no edema.       Left lower leg: He exhibits no edema.  Neurological: He is alert.  Skin: Skin is warm and dry. Capillary refill takes less than 2 seconds.  Psychiatric: He has a normal mood and affect.  Nursing note and  vitals reviewed.    ED Treatments / Results  Labs (all labs ordered are listed, but only abnormal results are displayed) Labs Reviewed  CBC WITH DIFFERENTIAL/PLATELET - Abnormal; Notable for the following components:      Result Value   WBC 13.7 (*)    Platelets 436 (*)    Neutro Abs 11.5 (*)    Monocytes Absolute 1.2 (*)    All other components within normal limits  COMPREHENSIVE METABOLIC PANEL - Abnormal; Notable for the following components:   Chloride 95 (*)    CO2 15 (*)    Glucose, Bld 302 (*)    BUN 46 (*)    Creatinine, Ser 1.41 (*)    Calcium 8.8 (*)    Albumin 3.3 (*)    Total Bilirubin 1.3 (*)    GFR calc non Af Amer 52 (*)    Anion gap 26 (*)    All other components within normal limits  BRAIN NATRIURETIC PEPTIDE - Abnormal; Notable for the following components:   B Natriuretic Peptide 129.3 (*)    All other components within normal limits  CBG MONITORING, ED - Abnormal; Notable for the following components:   Glucose-Capillary 302 (*)    All other components within normal limits  I-STAT VENOUS BLOOD GAS, ED - Abnormal; Notable for the following components:   pH, Ven 7.247 (*)    pCO2, Ven 32.2 (*)    pO2, Ven 31.0 (*)    Bicarbonate 14.1 (*)    TCO2 15 (*)    Acid-base deficit 12.0 (*)    All other components within normal limits  CBG MONITORING, ED - Abnormal; Notable for the following components:   Glucose-Capillary 324 (*)    All other components within normal limits  I-STAT CG4 LACTIC ACID, ED - Abnormal; Notable for the following components:   Lactic Acid, Venous 4.39 (*)    All other components within normal limits  CBG MONITORING, ED - Abnormal; Notable for the following components:   Glucose-Capillary 305 (*)    All other components within normal limits  CULTURE, BLOOD (ROUTINE X 2)  CULTURE, BLOOD (ROUTINE X 2)  LIPASE, BLOOD  BLOOD GAS, VENOUS  URINALYSIS, ROUTINE W REFLEX MICROSCOPIC  BETA-HYDROXYBUTYRIC ACID  I-STAT TROPONIN, ED  CBG  MONITORING, ED  I-STAT CG4 LACTIC ACID, ED    EKG EKG Interpretation  Date/Time:  Thursday November 07 2017 10:05:52 EDT Ventricular Rate:  93 PR Interval:    QRS Duration: 94 QT Interval:  327 QTC Calculation: 407 R Axis:   -18 Text Interpretation:  Sinus rhythm Prolonged PR interval Borderline left axis deviation Abnormal T, consider ischemia, anterior leads no prior  Confirmed by Virgina Norfolk [phone]) on 11/07/2017 10:18:38 AM   Radiology Dg Chest Portable 1 View  Result Date: 11/07/2017 CLINICAL DATA:  62 year old male with shortness of breath for 5 days. Nausea vomiting. EXAM: PORTABLE CHEST 1 VIEW COMPARISON:  [facility] portable chest x-ray 02/07/2015 and earlier. FINDINGS: Portable AP semi upright view at 1050 hours. Stable sequelae of median sternotomy. Cardiac and mediastinal contours are within normal limits. Visualized tracheal air column is within normal limits. Mixed veiling and confluent opacity in the right lung appears at least in part due to loculated pleural effusion. Streaky opacity at the left lung base more resembling atelectasis. No superimposed pneumothorax. Pulmonary vascularity appears within normal limits. Negative visible bowel gas pattern. No acute osseous abnormality identified. IMPRESSION: 1. Abnormal right lung opacity, new since 2016, appears at least in part related to loculated pleural effusion. 2. Left lung base atelectasis or scarring suspected. Electronically Signed   By: Odessa Fleming M.D.   On: 11/07/2017 11:20    Procedures .Critical Care Performed by: Virgina Norfolk, DO Authorized by: Virgina Norfolk, DO   Critical care provider statement:    Critical care time (minutes):  70   Critical care time was exclusive of:  Separately billable procedures and treating other patients and teaching time   Critical care was necessary to treat or prevent imminent or life-threatening deterioration of the following conditions:  Metabolic crisis and  respiratory failure   Critical care was time spent personally by me on the following activities:  Development of treatment plan with patient or surrogate, discussions with primary provider, examination of patient, evaluation of patient's response to treatment, ordering and performing treatments and interventions, ordering and review of laboratory studies, ordering and review of radiographic studies, pulse oximetry, re-evaluation of patient's condition and review of old charts   I assumed direction of critical care for this patient from another provider in my specialty: no     (including critical care time)  Medications Ordered in ED Medications  0.9 %  sodium chloride infusion (has no administration in time range)  insulin regular (NOVOLIN R,HUMULIN R) 100 Units in sodium chloride 0.9 % 100 mL (1 Units/mL) infusion (2.6 Units/hr Intravenous New Bag/Given 11/07/17 1105)  potassium chloride 10 mEq in 100 mL IVPB (0 mEq Intravenous Stopped 11/07/17 1204)  Ampicillin-Sulbactam (UNASYN) 3 g in sodium chloride 0.9 % 100 mL IVPB (has no administration in time range)  sodium chloride 0.9 % bolus 1,000 mL (0 mLs Intravenous Stopped 11/07/17 1034)  ondansetron (ZOFRAN) injection 4 mg (4 mg Intravenous Given 11/07/17 1001)  ipratropium-albuterol (DUONEB) 0.5-2.5 (3) MG/3ML nebulizer solution 3 mL (3 mLs Nebulization Given 11/07/17 1120)     Initial Impression / Assessment and Plan / ED Course  I have reviewed the triage vital signs and the nursing notes.  Pertinent labs & imaging results that were available during my care of the patient were reviewed by me and considered in my medical decision making (see chart for details).     Saed Hudlow is a 62 year old male with history of CAD, insulin-dependent diabetes who presents to the ED with nausea, vomiting, diarrhea for the last 5 days.  Patient with tachycardia and tachypnea upon arrival.  Patient 100% on room air.  Patient with symptoms for the past 5  days with no sick contacts.  Denies any fever, chills, abdominal pain.  Patient states that he has been unable to take his insulin therapy.  Patient denies any chest pain but he does feel  short of breath.  Does not have any DVT or PE risk factors.  Patient appears to have 2 small breathing on exam.  Overall has clear breath sounds.  No signs of volume overload on exam.  Concern for DKA from dehydration and GI losses and labs were obtained.  Patient was given a normal saline bolus.  Will reevaluate.  EKG shows sinus rhythm.  T wave changes but no prior to compare to.  Patient with acidosis of 7.24, anion gap of 26 and bicarb of 15.  Concern for DKA.  Patient to be started on insulin drip.  Likely from dehydration.  Patient had mild leukocytosis.  Called hospitalist for admission however upon reevaluation of patient he is increasingly tachycardic, tachypneic and now hypoxic to 80% on 2 L.  Increase oxygen to 15% to minimal improvement and patient placed on BIPAP given increased work of breathing and continued hypoxia. Lung exam w/ diminished breath sounds throughout with some wheezing and DuoNeb was given with improvement as well. No hx of COPD/asthma/smoking. Given abrupt change in respiratory status hospitalist recommends admission to ICU, agree.  Chest x-ray was obtained as well as troponin and BNP ordered.  Patient denies any history of heart failure.  Chest x-ray shows edema mostly in the right side with effusion.  Lactic acid was also obtained.  Lactic acid was elevated with elevated white count and concern for possible pneumonia, possible aspiration will treat with IV antibioitcs.  Blood cultures collected.   Patient with significant improvement following BiPAP but given acute hypoxic respiratory failure concern for possible flash pulmonary edema/pneumonia. At this time believe patient would benefit from ICU stay to monitor respiratory status and Dr. Lamar Benes accepted the patient in transfer to [facility].  Patient remained hemodynamically stable while on BiPAP, at time of transfer patient with BNP, trop, pending.  Final Clinical Impressions(s) / ED Diagnoses   Final diagnoses:  Diabetic ketoacidosis without coma associated with type 1 diabetes mellitus (HCC)  Acute respiratory failure with hypoxia [facility])    ED Discharge Orders    None       Virgina Norfolk, DO 11/07/17 1239    Virgina Norfolk, DO 11/07/17 1252    Virgina Norfolk, DO 11/07/17 1349

## 2017-11-07 NOTE — ED Notes (Signed)
Phone report given to [employer].

## 2017-11-07 NOTE — Progress Notes (Signed)
Pt received from [facility] placed in bed CHG bath & MRSA swab done and sent to lab pt on Bipap

## 2017-11-07 NOTE — ED Notes (Signed)
[facility] Team at bedside 

## 2017-11-07 NOTE — Procedures (Signed)
Arterial Catheter Insertion Procedure Note Brian Miller [id number] 1956-02-03  Procedure: Insertion of Arterial Catheter  Indications: Blood pressure monitoring and Frequent blood sampling  Procedure Details Consent: Risks of procedure as well as the alternatives and risks of each were explained to the (patient/caregiver).  Consent for procedure obtained. Time Out: Verified patient identification, verified procedure, site/side was marked, verified correct patient position, special equipment/implants available, medications/allergies/relevent history reviewed, required imaging and test results available.  Performed  Maximum sterile technique was used including antiseptics, cap, gloves, gown, hand hygiene, mask and sheet. Skin prep: Chlorhexidine; local anesthetic administered 20 gauge catheter was inserted into right radial artery using the Seldinger technique. ULTRASOUND GUIDANCE USED: NO Evaluation Blood flow good; BP tracing good. Complications: No apparent complications.   [name] 11/07/2017

## 2017-11-07 NOTE — ED Notes (Signed)
ED Provider at bedside. To discuss admission secondary to DKA dx.

## 2017-11-07 NOTE — H&P (Addendum)
NAME:  Brian Miller, MRN:  [id number], DOB:  1955/09/27, LOS: 0 ADMISSION DATE:  11/07/2017, CONSULTATION DATE: 9/19 REFERRING MD:  Lockie Mola, CHIEF COMPLAINT: Diabetic ketoacidosis  Brief History   62 year old diabetic presented to [facility] med center on 9/19 with nausea vomiting and diarrhea followed by 2 days of progressive cough and shortness of breath.  Initial blood work consistent with presumed diabetic ketoacidosis.  Following fluid resuscitation efforts the patient developed acute hypoxic respiratory failure and because of this he was transferred to the intensive care for admission  Significant Hospital Events   9/19: Admitted with working diagnosis of diabetic ketoacidosis as well as acute hypoxic respiratory failure with right greater than left airspace disease raising concern for aspiration pneumonia.  Work-up also initiated to rule out cardiac ischemia given presentation in 2015 when he occluded his circumflex artery was nausea and vomiting..  Consults: date of consult/date signed off & final recs:  Cardiology 9/19: inferior and lateral T wave inversion concern about ischemia   Procedures (surgical and bedside):  ECHO 9/19>>>  Significant Diagnostic Tests:  9/19: Beta hydroxybutyric acid>>> 9/19: Lipase>>> 50 (negative)   Micro Data: Blood cultures times two 9/19>>>  Antimicrobials:  Unasyn 9/19>>>  Subjective:  Shortness of breath improved  Objective   Blood pressure (Abnormal) 152/81, pulse (Abnormal) 109, temperature (Abnormal) 97.5 F (36.4 C), resp. rate (Abnormal) 38, SpO2 99 %.    FiO2 (%):  [65 %] 65 %   Intake/Output Summary (Last 24 hours) at 11/07/2017 1255 Last data filed at 11/07/2017 1204 Gross per 24 hour  Intake 1095 ml  Output no documentation  Net 1095 ml   There were no vitals filed for this visit.  Examination: General: Anxious 62 year old white male currently on BiPAP.  Tachypneic but not in acute distress HENT: Cephalic atraumatic no  jugular venous distention Lungs: Coarse bilateral bases positive accessory use Cardiovascular: Tachycardic regular rate and rhythm Abdomen: Nontender Extremities: Warm dry no edema Neuro: Oriented, awake, moves all extremities GU: Due to void  Resolved Hospital Problem list    Assessment & Plan:   Anion gap metabolic acidosis.  Appears to be a mix of both diabetic ketoacidosis further complicated by lactic acidosis -Suspect lactic acidosis secondary to progressive respiratory failure, however underlying sepsis physiology and endorgan dysfunction certainly possibility I am actually equally concerned about cardiogenic etiology Plan Admit to the intensive care KVO IVFs now given progressive respiratory failure  Insulin therapy with DKA protocol Checking beta hydroxybutyric acid Panculture Repeat lactic acid  Acute hypoxic respiratory failure: Secondary to right and left airspace disease, differential diagnosis includes aspiration pneumonitis versus volume overload versus mix of both Portable chest x-ray personally reviewed: Demonstrates new right greater than left airspace disease, looks like has loculated pleural fluid in fisure  Plan Admit to the intensive care Follow-up arterial blood gas Check urinary strep and Legionella antigen Procalcitonin algorithm Empiric Unasyn  SIRS; rule out sepsis vs primary cardiac event. superimposed on history of coronary artery disease. 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 -Suspect secondary to organ hypoperfusion Plan Supportive care Avoid hypotension Renal dose medications Strict intake output  Diabetes with hyperglycemia Plan DKA protocol starting Serial chemistries  Disposition / Summary of Today's Plan 11/07/17   62 year old male patient  with a history of coronary artery disease  in diabetes.  Admitting him with working diagnosis of: DKA, high acute hypoxic respiratory failure due to either aspiration pneumonia or pulmonary edema with volume overload, particularly concerned about possible acute MI given history.  Was admitted to the intensive care, initiate ischemia evaluation in addition to supportive care for DKA and possible aspiration event    Diet: Nothing by mouth Pain/Anxiety/Delirium protocol (if indicated): Not applicable currently, if intubated will place on PAD protocol VAP protocol (if indicated) not applicable DVT prophylaxis: Subcutaneous heparin GI prophylaxis:H2 Blockade Hyperglycemia protocol: DKA protocol Mobility:BR Code Status: full code Family Communication: pending  Labs   CBC: Recent Labs  Lab 11/07/17 0947  WBC 13.7*  NEUTROABS 11.5*  HGB 15.3  HCT 44.2  MCV 88.9  PLT 436*   Basic Metabolic Panel: Recent Labs  Lab 11/07/17 0947  NA 136  K 3.9  CL 95*  CO2 15*  GLUCOSE 302*  BUN 46*  CREATININE 1.41*  CALCIUM 8.8*   GFR: CrCl cannot be calculated (Unknown ideal weight.). Recent Labs  Lab 11/07/17 0947 11/07/17 1108  WBC 13.7*  --   LATICACIDVEN  --  4.39*   Liver Function Tests: Recent Labs  Lab 11/07/17 0947  AST 20  ALT 16  ALKPHOS 82  BILITOT 1.3*  PROT 7.9  ALBUMIN 3.3*   Recent Labs  Lab 11/07/17 0947  LIPASE 50   No results for input(s): AMMONIA in the last 168 hours. ABG    Component Value Date/Time   HCO3 14.1 (L) 11/07/2017 1006   TCO2 15 (L) 11/07/2017 1006   ACIDBASEDEF 12.0 (H) 11/07/2017 1006   O2SAT 53.0 11/07/2017 1006    Coagulation Profile: No results for input(s): INR, PROTIME in the last 168 hours. Cardiac Enzymes: No results for input(s): CKTOTAL, CKMB, CKMBINDEX, TROPONINI in the last 168 hours. HbA1C: No results found for: HGBA1C CBG: Recent Labs  Lab 11/07/17 0946 11/07/17 1059 11/07/17 1211  GLUCAP 302* 324* 305*     Admitting History of Present Illness.   62 year old insulin-dependent diabetic presented to the emergency room on 9/19 with chief complaint of 2 days shortness of breath after 3 days of  nausea, vomiting, and diarrhea over 5-day.  Denied sick exposures, fever, or significant abdominal pain.  Had not been taking his insulin in last 2 days.  Initial glucose 302, had a anion gap metabolic acidosis of 26, and mild leukocytosis.  On arrival he was found to be tachypneic, on arrival, but initially saturating 100% on room air.  He was treated with insulin therapy and IV fluid resuscitation.  On reevaluation his oxygen requirements increased, found to have pulse oximetry in the 80s on 2 L, required titration up to high flow and then eventually BiPAP.  Transferred to [facility] for admission and further evaluation and care Review of Systems:   Unable due to work of breathing.  Past medical history  He,  has a past medical history of Diabetes mellitus without complication (HCC).   Surgical History   CABG 2002   Social History   Social History   Socioeconomic History  . Marital status: Married    Spouse name: Not on file  . Number of children: Not on file  . Years of education: Not on file  . Highest education level: Not on file  Occupational History  . Not on file  Social Needs  . Financial resource strain: Not on file  . Food insecurity:    Worry: Not on file    Inability: Not  on file  . Transportation needs:    Medical: Not on file    Non-medical: Not on file  Tobacco Use  . Smoking status: Never Smoker  . Smokeless tobacco: Former Engineer, water and Sexual Activity  . Alcohol use: Not Currently  . Drug use: Never  . Sexual activity: Not on file  Lifestyle  . Physical activity:    Days per week: Not on file    Minutes per session: Not on file  . Stress: Not on file  Relationships  . Social connections:    Talks on phone: Not on file    Gets together: Not on file     Attends religious service: Not on file    Active member of club or organization: Not on file    Attends meetings of clubs or organizations: Not on file    Relationship status: Not on file  . Intimate partner violence:    Fear of current or ex partner: Not on file    Emotionally abused: Not on file    Physically abused: Not on file    Forced sexual activity: Not on file  Other Topics Concern  . Not on file  Social History Narrative  . Not on file  ,  reports that he has never smoked. He has quit using smokeless tobacco. He reports that he drank alcohol. He reports that he does not use drugs.   Family history   His family history is not on file.   Allergies No Known Allergies  Home meds  Prior to Admission medications   Medication Sig Start Date End Date Taking? Authorizing Provider  aspirin 81 MG chewable tablet Chew 81 mg by mouth daily.   Yes [provider]  insulin glargine (LANTUS) 100 UNIT/ML injection Inject 80 Units into the skin daily.   Yes [provider]  metFORMIN (GLUCOPHAGE) 1000 MG tablet Take 1,000 mg by mouth 2 (two) times daily with a meal.   Yes [provider]  metoprolol succinate (TOPROL-XL) 25 MG 24 hr tablet Take 25 mg by mouth daily.   Yes [provider]    Simonne Martinet ACNP-BC [facility] Pulmonary/Critical Care Pager # [phone] OR # [phone] if no answer  Attending Note:  62 year old male with extensive cardiac history who presents to [facility] with CP and acute respiratory failure.  On exam, the patient is in respiratory failure with diffuse crackles.  I reviewed CXR myself, pulmonary edema noted and pleural effusion noted with whiteout on the right.  Discussed with PCCM-NP.  I am very concerned that patient is having a cardiac event that resulted in his current condition.  Also concern for aspiration and pulmonary edema.  Will proceed with intubation.  Consulted emergently with cardiology, EF is  poor.  IVF post intubation.  Pan cultures.  Unasyn for aspiration coverage.  DKA protocol.  Family updated at length bedside.  PCCM will admit to the ICU.  The patient is critically ill with multiple organ systems failure and requires high complexity decision making for assessment and support, frequent evaluation and titration of therapies, application of advanced monitoring technologies and extensive interpretation of multiple databases.   Critical Care Time devoted to patient care services described in this note is  95  Minutes. This time reflects time of care of this signee Dr [name]. This critical care time does not reflect procedure time, or teaching time or supervisory time of PA/NP/Med student/Med Resident etc but  could involve care discussion time.  Rush Farmer, M.D. [facility] Pulmonary/Critical Care Medicine. Pager: [phone]. After hours pager: [phone].

## 2017-11-08 ENCOUNTER — Inpatient Hospital Stay (HOSPITAL_COMMUNITY): Payer: BLUE CROSS/BLUE SHIELD

## 2017-11-08 LAB — GLUCOSE, CAPILLARY
GLUCOSE-CAPILLARY: 192 mg/dL — AB (ref 70–99)
GLUCOSE-CAPILLARY: 205 mg/dL — AB (ref 70–99)
GLUCOSE-CAPILLARY: 166 mg/dL — AB (ref 70–99)
GLUCOSE-CAPILLARY: 142 mg/dL — AB (ref 70–99)
GLUCOSE-CAPILLARY: 196 mg/dL — AB (ref 70–99)
GLUCOSE-CAPILLARY: 201 mg/dL — AB (ref 70–99)
GLUCOSE-CAPILLARY: 240 mg/dL — AB (ref 70–99)
Glucose-Capillary: 138 mg/dL — ABNORMAL HIGH (ref 70–99)
Glucose-Capillary: 177 mg/dL — ABNORMAL HIGH (ref 70–99)
Glucose-Capillary: 167 mg/dL — ABNORMAL HIGH (ref 70–99)
Glucose-Capillary: 166 mg/dL — ABNORMAL HIGH (ref 70–99)
Glucose-Capillary: 164 mg/dL — ABNORMAL HIGH (ref 70–99)
Glucose-Capillary: 219 mg/dL — ABNORMAL HIGH (ref 70–99)
Glucose-Capillary: 186 mg/dL — ABNORMAL HIGH (ref 70–99)
Glucose-Capillary: 277 mg/dL — ABNORMAL HIGH (ref 70–99)

## 2017-11-08 LAB — MAGNESIUM
MAGNESIUM: 2.4 mg/dL (ref 1.7–2.4)
Magnesium: 2.6 mg/dL — ABNORMAL HIGH (ref 1.7–2.4)

## 2017-11-08 LAB — PROCALCITONIN: Procalcitonin: 0.47 ng/mL

## 2017-11-08 LAB — POCT I-STAT 3, ART BLOOD GAS (G3+)
ACID-BASE DEFICIT: 4 mmol/L — AB (ref 0.0–2.0)
Bicarbonate: 19.4 mmol/L — ABNORMAL LOW (ref 20.0–28.0)
O2 Saturation: 97 %
PH ART: 7.434 (ref 7.350–7.450)
PO2 ART: 83 mmHg (ref 83.0–108.0)
Patient temperature: 98.6
TCO2: 20 mmol/L — ABNORMAL LOW (ref 22–32)
pCO2 arterial: 29 mmHg — ABNORMAL LOW (ref 32.0–48.0)

## 2017-11-08 LAB — CBC
HEMATOCRIT: 31.7 % — AB (ref 39.0–52.0)
HEMOGLOBIN: 11 g/dL — AB (ref 13.0–17.0)
MCH: 30.7 pg (ref 26.0–34.0)
MCHC: 34.7 g/dL (ref 30.0–36.0)
MCV: 88.5 fL (ref 78.0–100.0)
Platelets: 334 10*3/uL (ref 150–400)
RBC: 3.58 MIL/uL — AB (ref 4.22–5.81)
RDW: 12.6 % (ref 11.5–15.5)
WBC: 12.5 10*3/uL — AB (ref 4.0–10.5)

## 2017-11-08 LAB — HEPARIN LEVEL (UNFRACTIONATED)
HEPARIN UNFRACTIONATED: 0.25 [IU]/mL — AB (ref 0.30–0.70)
Heparin Unfractionated: <0.1 IU/mL — ABNORMAL LOW (ref 0.30–0.70)

## 2017-11-08 LAB — COMPREHENSIVE METABOLIC PANEL
ALBUMIN: 2.2 g/dL — AB (ref 3.5–5.0)
ALT: 14 U/L (ref 0–44)
ANION GAP: 10 (ref 5–15)
AST: 21 U/L (ref 15–41)
Alkaline Phosphatase: 59 U/L (ref 38–126)
BUN: 43 mg/dL — ABNORMAL HIGH (ref 8–23)
CO2: 19 mmol/L — AB (ref 22–32)
Calcium: 7.9 mg/dL — ABNORMAL LOW (ref 8.9–10.3)
Chloride: 107 mmol/L (ref 98–111)
Creatinine, Ser: 1.17 mg/dL (ref 0.61–1.24)
GFR calc Af Amer: >60 mL/min (ref >60)
GFR calc non Af Amer: >60 mL/min (ref >60)
GLUCOSE: 181 mg/dL — AB (ref 70–99)
POTASSIUM: 3 mmol/L — AB (ref 3.5–5.1)
Sodium: 136 mmol/L (ref 135–145)
TOTAL PROTEIN: 5.6 g/dL — AB (ref 6.5–8.1)
Total Bilirubin: 0.7 mg/dL (ref 0.3–1.2)

## 2017-11-08 LAB — PHOSPHORUS
PHOSPHORUS: 1.3 mg/dL — AB (ref 2.5–4.6)
Phosphorus: 1.1 mg/dL — ABNORMAL LOW (ref 2.5–4.6)

## 2017-11-08 LAB — LEGIONELLA PNEUMOPHILA SEROGP 1 UR AG: L. pneumophila Serogp 1 Ur Ag: NEGATIVE

## 2017-11-08 LAB — HEMOGLOBIN A1C
HEMOGLOBIN A1C: 7.9 % — AB (ref 4.8–5.6)
MEAN PLASMA GLUCOSE: 180.03 mg/dL

## 2017-11-08 LAB — TROPONIN I
Troponin I: 2 ng/mL (ref <0.03)
Troponin I: 1.42 ng/mL (ref <0.03)

## 2017-11-08 MED ORDER — FAMOTIDINE 40 MG/5ML PO SUSR
20.0000 mg | Freq: Two times a day (BID) | ORAL | Status: DC
  Administered 2017-11-08 (×2): 20 mg
  Filled 2017-11-08 (×3): qty 2.5

## 2017-11-08 MED ORDER — INSULIN ASPART 100 UNIT/ML ~~LOC~~ SOLN
7.0000 [IU] | SUBCUTANEOUS | Status: DC
  Administered 2017-11-08: 7 [IU] via SUBCUTANEOUS

## 2017-11-08 MED ORDER — ASPIRIN 81 MG PO CHEW: 81.0000 mg | CHEWABLE_TABLET | Freq: Every day | ORAL | Status: DC

## 2017-11-08 MED ORDER — VITAL HIGH PROTEIN PO LIQD
1000.0000 mL | ORAL | Status: DC
  Administered 2017-11-08: 1000 mL

## 2017-11-08 MED ORDER — DEXTROSE 10 % IV SOLN: INTRAVENOUS | Status: DC | PRN

## 2017-11-08 MED ORDER — INFLUENZA VAC SPLIT QUAD 0.5 ML IM SUSY
0.5000 mL | PREFILLED_SYRINGE | INTRAMUSCULAR | Status: AC
  Administered 2017-11-13: 13:00:00 0.5 mL via INTRAMUSCULAR
  Filled 2017-11-08: qty 0.5

## 2017-11-08 MED ORDER — POTASSIUM CHLORIDE 20 MEQ/15ML (10%) PO SOLN
20.0000 meq | Freq: Once | ORAL | Status: AC
  Administered 2017-11-08: 20 meq
  Filled 2017-11-08: qty 15

## 2017-11-08 MED ORDER — FUROSEMIDE 10 MG/ML IJ SOLN
20.0000 mg | Freq: Once | INTRAMUSCULAR | Status: AC
  Administered 2017-11-08: 20 mg via INTRAVENOUS
  Filled 2017-11-08: qty 2

## 2017-11-08 MED ORDER — INSULIN ASPART 100 UNIT/ML ~~LOC~~ SOLN
7.0000 [IU] | SUBCUTANEOUS | Status: DC
  Administered 2017-11-08 – 2017-11-09 (×4): 7 [IU] via SUBCUTANEOUS

## 2017-11-08 MED ORDER — INSULIN ASPART 100 UNIT/ML ~~LOC~~ SOLN
0.0000 [IU] | SUBCUTANEOUS | Status: DC
  Administered 2017-11-08 – 2017-11-09 (×3): 8 [IU] via SUBCUTANEOUS
  Administered 2017-11-09: 5 [IU] via SUBCUTANEOUS

## 2017-11-08 MED ORDER — HEPARIN BOLUS VIA INFUSION
3000.0000 [IU] | Freq: Once | INTRAVENOUS | Status: AC
  Administered 2017-11-08: 3000 [IU] via INTRAVENOUS
  Filled 2017-11-08: qty 3000

## 2017-11-08 MED ORDER — SODIUM CHLORIDE 0.9 % IV SOLN
3.0000 g | Freq: Four times a day (QID) | INTRAVENOUS | Status: DC
  Administered 2017-11-08 – 2017-11-09 (×4): 3 g via INTRAVENOUS
  Filled 2017-11-08 (×5): qty 3

## 2017-11-08 MED ORDER — INSULIN ASPART 100 UNIT/ML ~~LOC~~ SOLN
2.0000 [IU] | SUBCUTANEOUS | Status: DC
  Administered 2017-11-08: 4 [IU] via SUBCUTANEOUS

## 2017-11-08 MED ORDER — INSULIN ASPART 100 UNIT/ML ~~LOC~~ SOLN: 0.0000 [IU] | SUBCUTANEOUS | Status: DC

## 2017-11-08 MED ORDER — INSULIN DETEMIR 100 UNIT/ML ~~LOC~~ SOLN
25.0000 [IU] | Freq: Two times a day (BID) | SUBCUTANEOUS | Status: DC
  Administered 2017-11-08: 25 [IU] via SUBCUTANEOUS
  Filled 2017-11-08 (×2): qty 0.25

## 2017-11-08 MED ORDER — FAMOTIDINE IN NACL 20-0.9 MG/50ML-% IV SOLN: 20.0000 mg | Freq: Two times a day (BID) | INTRAVENOUS | Status: DC

## 2017-11-08 MED ORDER — ACETAMINOPHEN 160 MG/5ML PO SOLN
650.0000 mg | Freq: Four times a day (QID) | ORAL | Status: DC | PRN
  Administered 2017-11-08: 650 mg
  Filled 2017-11-08: qty 20.3

## 2017-11-08 MED ORDER — POTASSIUM PHOSPHATES 15 MMOLE/5ML IV SOLN
30.0000 mmol | Freq: Once | INTRAVENOUS | Status: AC
  Administered 2017-11-08: 30 mmol via INTRAVENOUS
  Filled 2017-11-08: qty 10

## 2017-11-08 MED ORDER — PRO-STAT SUGAR FREE PO LIQD
30.0000 mL | Freq: Two times a day (BID) | ORAL | Status: DC
  Administered 2017-11-08 (×2): 30 mL
  Filled 2017-11-08 (×3): qty 30

## 2017-11-08 MED ORDER — PHENOL 1.4 % MT LIQD
1.0000 | OROMUCOSAL | Status: DC | PRN
  Administered 2017-11-08: 1 via OROMUCOSAL
  Filled 2017-11-08: qty 177

## 2017-11-08 MED ORDER — ASPIRIN 81 MG PO CHEW
81.0000 mg | CHEWABLE_TABLET | Freq: Every day | ORAL | Status: DC
  Administered 2017-11-09 – 2017-11-16 (×8): 81 mg
  Filled 2017-11-08 (×8): qty 1

## 2017-11-08 MED ORDER — POTASSIUM CHLORIDE 20 MEQ/15ML (10%) PO SOLN
40.0000 meq | Freq: Once | ORAL | Status: AC
  Administered 2017-11-08: 40 meq
  Filled 2017-11-08: qty 30

## 2017-11-08 NOTE — Progress Notes (Signed)
Progress Note  Patient Name: Brian Miller Date of Encounter: 11/08/2017  Primary Cardiologist:  Junagadhwalla/Costello ([facility])  Subjective   Intubated, but appears very calm and comfortable and is able to communicate with gestures and in writing. Cardiac enzymes with mild increase, peak 2.00, now declining.  Inpatient Medications    Scheduled Meds: . [START ON 11/09/2017] aspirin  81 mg Per Tube Daily  . chlorhexidine gluconate (MEDLINE KIT)  15 mL Mouth Rinse BID  . famotidine  20 mg Per Tube BID  . feeding supplement (PRO-STAT SUGAR FREE 64)  30 mL Per Tube BID  . feeding supplement (VITAL HIGH PROTEIN)  1,000 mL Per Tube Q24H  . insulin aspart  0-20 Units Subcutaneous Q4H  . mouth rinse  15 mL Mouth Rinse 10 times per day  . potassium chloride  20 mEq Per Tube Once   Continuous Infusions: . sodium chloride 10 mL/hr at 11/08/17 0900  . ampicillin-sulbactam (UNASYN) IV 3 g (11/08/17 1049)  . heparin 1,550 Units/hr (11/08/17 1038)  . insulin (NOVOLIN-R) infusion 4.2 Units/hr (11/08/17 1057)  . propofol (DIPRIVAN) infusion 20 mcg/kg/min (11/08/17 0900)   PRN Meds: bisacodyl, docusate, fentaNYL (SUBLIMAZE) injection, fentaNYL (SUBLIMAZE) injection   Vital Signs    Vitals:   11/08/17 1000 11/08/17 1100 11/08/17 1128 11/08/17 1140  BP: (!) 120/55 (!) 122/58    Pulse: 76 75    Resp: (!) 21 19    Temp:    99.5 F (37.5 C)  TempSrc:    Oral  SpO2: 96% 96% 95%   Weight:      Height:        Intake/Output Summary (Last 24 hours) at 11/08/2017 1155 Last data filed at 11/08/2017 0900 Gross per 24 hour  Intake 2102.67 ml  Output 1985 ml  Net 117.67 ml   Filed Weights   11/07/17 1300  Weight: 82 kg    Telemetry    NSR - Personally Reviewed  ECG    No new tracing - Personally Reviewed  Physical Exam  Intubated sedated. GEN: No acute distress.   Neck: No JVD Cardiac: RRR, no murmurs, rubs, or gallops.  Respiratory: Clear to auscultation bilaterally. GI:  Soft, nontender, non-distended  MS: No edema; No deformity. Neuro:  Nonfocal  Psych: Normal affect   Labs    Chemistry Recent Labs  Lab 11/07/17 0947 11/07/17 1353 11/07/17 1744 11/08/17 0753  NA 136 133* 135 136  K 3.9 3.9 3.6 3.0*  CL 95* 99 103 107  CO2 15* 11* 18* 19*  GLUCOSE 302* 298* 203* 181*  BUN 46* 42* 43* 43*  CREATININE 1.41* 1.59* 1.29* 1.17  CALCIUM 8.8* 8.2* 7.9* 7.9*  PROT 7.9  --   --  5.6*  ALBUMIN 3.3*  --   --  2.2*  AST 20  --   --  21  ALT 16  --   --  14  ALKPHOS 82  --   --  59  BILITOT 1.3*  --   --  0.7  GFRNONAA 52* 45* 58* >60  GFRAA >60 52* >60 >60  ANIONGAP 26* 23* 14 10     Hematology Recent Labs  Lab 11/07/17 0947 11/08/17 0348  WBC 13.7* 12.5*  RBC 4.97 3.58*  HGB 15.3 11.0*  HCT 44.2 31.7*  MCV 88.9 88.5  MCH 30.8 30.7  MCHC 34.6 34.7  RDW 12.9 12.6  PLT 436* 334    Cardiac Enzymes Recent Labs  Lab 11/07/17 1353 11/07/17 1744 11/08/17 0004 11/08/17  0753  TROPONINI 0.59* 1.77* 2.00* 1.42*   No results for input(s): TROPIPOC in the last 168 hours.   BNP Recent Labs  Lab 11/07/17 0947  BNP 129.3*     DDimer No results for input(s): DDIMER in the last 168 hours.   Radiology    Dg Chest Port 1 View  Result Date: 11/08/2017 CLINICAL DATA:  Respiratory failure EXAM: PORTABLE CHEST 1 VIEW COMPARISON:  11/07/2017 FINDINGS: Cardiac shadow is enlarged but stable. Left subclavian central line is noted in satisfactory position. Endotracheal tube and nasogastric catheter are again seen in satisfactory position. Right-sided pleural effusion is again noted but appears slightly improved although this may be positional in nature. Small left effusion is noted as well. No focal confluent infiltrate is seen. IMPRESSION: Bilateral pleural effusions right greater than left. No focal infiltrate is seen. Tubes and lines as described. Electronically Signed   By: Inez Catalina M.D.   On: 11/08/2017 08:40   Dg Chest Port 1 View  Result  Date: 11/07/2017 CLINICAL DATA:  Intubated EXAM: PORTABLE CHEST 1 VIEW COMPARISON:  None. FINDINGS: Endotracheal tube tip is 2.6 cm above the carina. Enteric tube terminates in the gastric fundus. Left subclavian central venous catheter terminates at the cavoatrial junction. Intact sternotomy wires. Low lung volumes. Top-normal heart size. Otherwise normal mediastinal contour. No pneumothorax. Bilateral pleural thickening/effusions, most prominent in the upper right pleural space and along the right fissures. Hazy parahilar and bibasilar opacities in both lungs. IMPRESSION: 1. Well-positioned support structures.  No pneumothorax. 2. Bilateral pleural thickening/effusions, most prominent in the upper right pleural space and along the right fissures. 3. Hazy parahilar and bibasilar opacities in both lungs, which could represent atelectasis, aspiration or pneumonia. Electronically Signed   By: Ilona Sorrel M.D.   On: 11/07/2017 16:48   Dg Chest Portable 1 View  Result Date: 11/07/2017 CLINICAL DATA:  62 year old male with shortness of breath for 5 days. Nausea vomiting. EXAM: PORTABLE CHEST 1 VIEW COMPARISON:  [facility] portable chest x-ray 02/07/2015 and earlier. FINDINGS: Portable AP semi upright view at 1050 hours. Stable sequelae of median sternotomy. Cardiac and mediastinal contours are within normal limits. Visualized tracheal air column is within normal limits. Mixed veiling and confluent opacity in the right lung appears at least in part due to loculated pleural effusion. Streaky opacity at the left lung base more resembling atelectasis. No superimposed pneumothorax. Pulmonary vascularity appears within normal limits. Negative visible bowel gas pattern. No acute osseous abnormality identified. IMPRESSION: 1. Abnormal right lung opacity, new since 2016, appears at least in part related to loculated pleural effusion. 2. Left lung base atelectasis or scarring suspected. Electronically Signed    By: Genevie Ann M.D.   On: 11/07/2017 11:20    Cardiac Studies  Echo 11/07/2017  - Left ventricle: The cavity size was normal. Wall thickness was   increased in a pattern of mild LVH. Systolic function was normal.   The estimated ejection fraction was in the range of 55% to 60%. - Aortic valve: There was mild regurgitation. - Mitral valve: Calcified annulus. There was mild regurgitation.   On my review there is indeed normal LVEF 60%, but there is mid-anteroseptal severe hypokineses (likely occluded mid LAD segment, with patent LIMA to LAD). E/A<<1.0, c/w low mean left atrial pressure.  Patient Profile     62 y.o. male with long-standing CAD, previous bypass surgery and previous left circumflex stent for occluded bypass graft to OM, presents with diabetic ketoacidosis and suspicion  of right aspiration pneumonia, with prominent changes on ECG (no baseline for comparison) and mild elevation in cardiac enzymes.  Assessment & Plan    1. NSTEMI: Suspect demand ischemia in the setting of markedly increased myocardial oxygen demand and severe systemic illness.  Doubt true acute atherothrombotic syndrome.  No indication for emergency coronary angiography.  We will perform some type of risk stratification (or may be even proceed directly to coronary angiography) after acute respiratory failure has resolved. 2.  Acute respiratory failure with hypoxia: Chest x-ray continues to be very asymmetrical with virtual whiteout of the right lung but minimal changes on the left side.  Suspect right pneumonia (possible aspiration) is the reason for this.  Clinical exam and echo did not support acute heart failure. 3. DKA: Trigger for decompensation is uncertain, (but I would not expect a small myocardial infarction with a troponin of 2.0 to trigger DKA).  Appears that dyspnea preceded the DKA and that his intake of food was very poor for as much as a week before his acute presentation. 4. PAD, history of toe  amputation 5. AKI: Prerenal, rapidly resolved with hydration.     For questions or updates, please contact [place] Please consult [email or web address] for contact info under        Signed, Sanda Klein, MD  11/08/2017, 11:55 AM

## 2017-11-08 NOTE — Progress Notes (Signed)
Tried patient on SBT this AM, patient went apneic due to sedation, will try when sedation is weaned down.

## 2017-11-08 NOTE — Progress Notes (Signed)
ANTICOAGULATION CONSULT NOTE   Pharmacy Consult for heparin Indication: chest pain/ACS  No Known Allergies  Patient Measurements: Height: 5\' 10"  (177.8 cm) Weight: 181 lb 3.5 oz (82.2 kg) IBW/kg (Calculated) : 73 HEPARIN DW (KG): 82  Vital Signs: Temp: 100.3 F (37.9 C) (09/20 1549) Temp Source: Oral (09/20 1549) BP: 127/56 (09/20 1800) Pulse Rate: 77 (09/20 1800)  Labs: Recent Labs    11/07/17 0947  11/07/17 1353 11/07/17 1744 11/08/17 0004 11/08/17 0018 11/08/17 0348 11/08/17 0753 11/08/17 1618  HGB 15.3  --   --   --   --   --  11.0*  --   --   HCT 44.2  --   --   --   --   --  31.7*  --   --   PLT 436*  --   --   --   --   --  334  --   --   HEPARINUNFRC  --   --   --   --   --  <0.10*  --  <0.10* 0.25*  CREATININE 1.41*  --  1.59* 1.29*  --   --   --  1.17  --   TROPONINI  --    < > 0.59* 1.77* 2.00*  --   --  1.42*  --    < > = values in this interval not displayed.    Estimated Creatinine Clearance: 68.5 mL/min (by C-G formula based on SCr of 1.17 mg/dL).  Assessment: 62 yo m [facility] on 11/07/2017 with N/V/D, SOB, cough. Admitted for DKA and transferred to [facility] ICU for acute hypoxic respiratory failure requiring intubation. Pharmacy has been consulted to start heparin infusion for r/o ACS.  Heparin level slightly below goal, no issues with IV infusion per RN.  No overt bleeding or complications noted.   Goal of Therapy:  Heparin level 0.3-0.7 units/ml Monitor platelets by anticoagulation protocol: Yes   Plan:  Increase heparin rate to 1700 units/hr Recheck heparin level with AM labs. Daily Heparin level, CBC while on heparin Monitor for s/sx bleeding  Jenetta Downer, [facility] Clinical Pharmacist Phone [phone]  11/08/2017 7:35 PM

## 2017-11-08 NOTE — Progress Notes (Signed)
ANTICOAGULATION CONSULT NOTE - Initial Consult  Pharmacy Consult for heparin Indication: chest pain/ACS  No Known Allergies  Patient Measurements: Height: 5\' 10"  (177.8 cm) Weight: 180 lb 12.4 oz (82 kg) IBW/kg (Calculated) : 73 HEPARIN DW (KG): 82  Vital Signs: BP: 119/54 (09/20 0800) Pulse Rate: 76 (09/20 0800)  Labs: Recent Labs    11/07/17 0947 11/07/17 1353 11/07/17 1744 11/08/17 0004 11/08/17 0018 11/08/17 0348 11/08/17 0753  HGB 15.3  --   --   --   --  11.0*  --   HCT 44.2  --   --   --   --  31.7*  --   PLT 436*  --   --   --   --  334  --   HEPARINUNFRC  --   --   --   --  <0.10*  --  <0.10*  CREATININE 1.41* 1.59* 1.29*  --   --   --   --   TROPONINI  --  0.59* 1.77* 2.00*  --   --   --     Estimated Creatinine Clearance: 62.1 mL/min (A) (by C-G formula based on SCr of 1.29 mg/dL (H)).  Assessment: 62 yo m presenting to [facility] on 11/07/2017 with N/V/D, SOB, cough. Admitted for DKA and transferred to [facility] ICU for acute hypoxic respiratory failure requiring intubation. Pharmacy has been consulted to start heparin infusion for r/o ACS.  Heparin level this morning has been undetectable at <0.1. No documented bleeding and no issues with the heparin infusion or line that it is infusing in overnight or this morning per RN. Hgb trending down today to 12.5, with platelets WNL. The patient's renal function is improving with SCr trending down to 1.3.   Goal of Therapy:  Heparin level 0.3-0.7 units/ml Monitor platelets by anticoagulation protocol: Yes   Plan:  Heparin bolus 3000 units x 1 Increase heparin rate to 1550 units/hr HL in 6 hours Daily HL, CBC while on heparin Monitor for s/sx bleeding  Harlow Mares, PharmD PGY1 Pharmacy Resident Phone [phone]  11/08/2017   9:07 AM

## 2017-11-08 NOTE — Progress Notes (Signed)
CRITICAL VALUE ALERT  Critical Value: Troponin    Date & Time Notied:  11/08/17 03:25   Provider Notified: Pola Corn Nurse   Orders Received/Actions taken: Elink nurse will notify MD

## 2017-11-08 NOTE — Progress Notes (Signed)
Inpatient Diabetes Program Recommendations  AACE/ADA: New Consensus Statement on Inpatient Glycemic Control (2015)  Target Ranges:  Prepandial:   less than 140 mg/dL      Peak postprandial:   less than 180 mg/dL (1-2 hours)      Critically ill patients:  140 - 180 mg/dL   Lab Results  Component Value Date   GLUCAP 196 (H) 11/08/2017    Review of Glycemic Control  Diabetes history: Type 2 Outpatient Diabetes medications: Lantus 80 units daily, Amaryl 4 mg BID, Jardiance 25 mg daily, Metformin 1000 mg BID. Current orders for Inpatient glycemic control: IV insulin Transition orders: Levemir 25 units BID, Novolog 2-6 units every 4 hours, Novolog 7 units every 4 hours.  Inpatient Diabetes Program Recommendations:    Received diabetes coordinator consult. Noted home medications. Noted patient takes [place] Pepper which could increase risk for DKA and cause dehydration. Will continue to monitor blood sugars. Will talk with patient when able to communicate.   Smith Mince RN BSN CDE Diabetes Coordinator Pager: [phone]  8am-5pm

## 2017-11-08 NOTE — Care Management Note (Signed)
Case Management Note  Patient Details  Name: Brian Miller MRN: [id number] Date of Birth: 1956-02-10  Subjective/Objective:  From home, presents with DKA, acute hypoxic resp failure, SIRS, AKI.                  Action/Plan: NCM will follow for transition of care needs.   Expected Discharge Date:                  Expected Discharge Plan:     In-House Referral:     Discharge planning Services  CM Consult  Post Acute Care Choice:    Choice offered to:     DME Arranged:    DME Agency:     HH Arranged:    HH Agency:     Status of Service:  In process, will continue to follow  If discussed at Long Length of Stay Meetings, dates discussed:    Additional Comments:  Leone Haven, RN 11/08/2017, 1:18 PM

## 2017-11-08 NOTE — Progress Notes (Signed)
ANTICOAGULATION CONSULT NOTE - Follow Up Consult  Pharmacy Consult for heparin Indication: ACS  Labs: Recent Labs    11/07/17 0947 11/07/17 1353 11/07/17 1744 11/08/17 0018  HGB 15.3  --   --   --   HCT 44.2  --   --   --   PLT 436*  --   --   --   HEPARINUNFRC  --   --   --  <0.10*  CREATININE 1.41* 1.59* 1.29*  --   TROPONINI  --  0.59* 1.77*  --     Assessment: 62yo male undectable on heparin with initial dosing for ACS.  Goal of Therapy:  Heparin level 0.3-0.7 units/ml   Plan:  Will rebolus with heparin 3000 units x1 and increase heparin gtt by 4 units/kg/hr to 1300 units/hr and check level in 6 hours.    Vernard Gambles, PharmD, BCPS  11/08/2017,1:08 AM

## 2017-11-08 NOTE — Progress Notes (Signed)
CCM MD placed patient on SBT 5/5 40%, patient lasted 9 minutes then RR increased to 38 BPM and patient became severely agitated, placed back on full support MD aware.

## 2017-11-08 NOTE — Progress Notes (Addendum)
NAME:  Myreon Wimer, MRN:  [id number], DOB:  [birth year], LOS: 1 ADMISSION DATE:  11/07/2017, CONSULTATION DATE: 9/19 REFERRING MD:  Lockie Mola, CHIEF COMPLAINT: Diabetic ketoacidosis  Brief History   62 year old diabetic presented to [facility] med center on 9/19 with nausea vomiting and diarrhea followed by 2 days of progressive cough and shortness of breath.  Initial blood work consistent with presumed diabetic ketoacidosis.  Following fluid resuscitation efforts the patient developed acute hypoxic respiratory failure and because of this he was transferred to the intensive care for admission  Significant Hospital Events   9/19: Admitted with working diagnosis of diabetic ketoacidosis as well as acute hypoxic respiratory failure with right greater than left airspace disease raising concern for aspiration pneumonia.  Work-up also initiated to rule out cardiac ischemia given presentation in 2015 when he occluded his circumflex artery was nausea and vomiting.  Required intubation on arrival secondary to respiratory distress.  Consults: date of consult/date signed off & final recs:  Cardiology 9/19: inferior and lateral T wave inversion concern about ischemia   Procedures (surgical and bedside):  ECHO 9/19>>> - Left ventricle: The cavity size was normal. Wall thickness was   increased in a pattern of mild LVH. Systolic function was normal.   The estimated ejection fraction was in the range of 55% to 60%. - Aortic valve: There was mild regurgitation. - Mitral valve: Calcified annulus. There was mild regurgitation  Significant Diagnostic Tests:  9/19: Beta hydroxybutyric acid>>> 7.70 9/19: Lipase>>> 50 (negative)   Micro Data: Blood cultures x2 9/19>> canceled MRSA PCR 9/19 > neg  Antimicrobials:  Unasyn 9/19>>>  Subjective:  Patient denies CP, pain, or SOB Remains on propofol at 20, although fully awake and communicating but failed SBT this am 2/2 no initiation Remains on heparin gtt/  insulin gtt, RN reports no events overnight  Objective   Blood pressure (!) 117/57, pulse 76, temperature 99.4 F (37.4 C), temperature source Oral, resp. rate 15, height 5\' 10"  (1.778 m), weight 82 kg, SpO2 95 %.    Vent Mode: PRVC FiO2 (%):  [40 %-100 %] 40 % Set Rate:  [12 bmp-20 bmp] 12 bmp Vt Set:  [580 mL] 580 mL PEEP:  [5 cmH20] 5 cmH20 Plateau Pressure:  [21 cmH20-25 cmH20] 21 cmH20   Intake/Output Summary (Last 24 hours) at 11/08/2017 4098 Last data filed at 11/08/2017 0900 Gross per 24 hour  Intake 3097.67 ml  Output 1985 ml  Net 1112.67 ml   Filed Weights   11/07/17 1300  Weight: 82 kg    Examination: General:  Critically ill adult male on MV in NAD currently HEENT: MM pink/moist, pupils 3/reactive, ETT/ OGT Neuro: Awake, f/c, MAE, writing to communicate  CV: SR 76, no murmur, +2 pulses PULM: even/non-labored on MV, breathing over set rate, lungs bilaterally clear GI: soft, non-tender, bs active  Extremities: warm/dry, no edema  Skin: no rashes   Resolved Hospital Problem list    Assessment & Plan:   Anion gap metabolic acidosis. Mixed diabetic ketoacidosis further complicated by lactic acidosis from respiratory failure +/- cardiogenic component  Plan Stop MIVF Continue insulin gtt with transition to SSI beta hydroxybutyric acid 7.70, AG now closed this am  Normal lactate trend   R/O ACS / NSTEMI - TTE 9/20 with mild LVH, EF 55-60%, mild AR, mild MR - patient currently without chest pain/ discomfort P:  Appreciate cardiology input TTE as above Heparin gtt per pharmacy  ASA changed to per tube daily  EKG this am  with slightly improved STD V1-V5 Troponin peaked overnight, now down trending 0.59 -> 1.77 -> 2 -> 1.42  Acute hypoxic respiratory failure: Secondary to pulmonary edema and r/o aspiration PNA CXR - good position of CVL, ETT, OGT; slight improvement in aeration on right airspace disease, asymmetric, effusion +/- inflitrate, stable left  effusion Plan Continue full MV support 8 cc/kg No significant O2 requirements, peep 5/ Fi02 40 Ongoing attempts at SBT trials  Trend CXR/ ABG PCT 0.32 and trending awaiting am PCT,  Tmax 99.4, improved WBC Continue emperiic abx Send sputum cx  urinary strep negative Urine Legionella antigen pending Will evaluate effusions with bedside U/S w/attending Gentle diuresis with KCL replete  VAP measures PAD protocol with low dose propofol and prn fentanyl, RASS goal 0/-1 Bowel regimen and SUP ordered   SIRS; sepsis vs primary cardiac event -hx CAD s/p CABG back in 2002, also PCI to circumflex in 2015 with similar presentation w/his last MI Plan Continue clear site monitoring- CI 4.1, SV 96, SVi 48 Not requiring pressors, goal MAP > 65 lactate normal  Trending troponin/ EKG Heparin as above  Monitor fever curve/ WBC, if PCT remains low consider d/c abx  Acute kidney injury. - improving sCr - adequate UOP - +1.4 L  - normal lactate Plan Lasix 20mg  x 1  Stop MIVF Trend BMP / urinary output/ daily wts Replace electrolytes as indicated Avoid nephrotoxic agents  Hypokalemia P:  Replete 40 meq now and repeat 20 meq this afternoon  Assess mag  Diabetes with DKA Plan AG closed DKA protocol transition from IV insulin to SSI Add lantus 25 units BID per protocol  Checking HgbA1c Consult diabetes coordinator   Disposition / Summary of Today's Plan 11/08/17   Transition off IV insulin gtt Ongoing attempt at SBT Hemodynamically stable/ AG closed, and normal lactate will start gentle diuresis  Continue empiric aspiration coverage for now    Diet: start TF  Pain/Anxiety/Delirium protocol (if indicated): PAD w/propofol and prn fentanyl VAP protocol (if indicated) yes DVT prophylaxis: systemic heparin  GI prophylaxis:H2 Blockade Hyperglycemia protocol: DKA protocol transition to SSI Mobility:BR Code Status: full code Family Communication: no family at bedside.  Patient  updated on plan of care   Labs   CBC: Recent Labs  Lab 11/07/17 0947 11/08/17 0348  WBC 13.7* 12.5*  NEUTROABS 11.5*  --   HGB 15.3 11.0*  HCT 44.2 31.7*  MCV 88.9 88.5  PLT 436* 334   Basic Metabolic Panel: Recent Labs  Lab 11/07/17 0947 11/07/17 1353 11/07/17 1744  NA 136 133* 135  K 3.9 3.9 3.6  CL 95* 99 103  CO2 15* 11* 18*  GLUCOSE 302* 298* 203*  BUN 46* 42* 43*  CREATININE 1.41* 1.59* 1.29*  CALCIUM 8.8* 8.2* 7.9*   GFR: Estimated Creatinine Clearance: 62.1 mL/min (A) (by C-G formula based on SCr of 1.29 mg/dL (H)). Recent Labs  Lab 11/07/17 0947 11/07/17 1108 11/07/17 1353 11/07/17 1443 11/07/17 1744 11/08/17 0348  PROCALCITON  --   --  0.32  --   --   --   WBC 13.7*  --   --   --   --  12.5*  LATICACIDVEN  --  4.39*  --  1.5 1.2  --    Liver Function Tests: Recent Labs  Lab 11/07/17 0947  AST 20  ALT 16  ALKPHOS 82  BILITOT 1.3*  PROT 7.9  ALBUMIN 3.3*   Recent Labs  Lab 11/07/17 0947  LIPASE 50  No results for input(s): AMMONIA in the last 168 hours. ABG    Component Value Date/Time   PHART 7.434 11/08/2017 0738   PCO2ART 29.0 (L) 11/08/2017 0738   PO2ART 83.0 11/08/2017 0738   HCO3 19.4 (L) 11/08/2017 0738   TCO2 20 (L) 11/08/2017 0738   ACIDBASEDEF 4.0 (H) 11/08/2017 0738   O2SAT 97.0 11/08/2017 0738    Coagulation Profile: No results for input(s): INR, PROTIME in the last 168 hours. Cardiac Enzymes: Recent Labs  Lab 11/07/17 1353 11/07/17 1744 11/08/17 0004  TROPONINI 0.59* 1.77* 2.00*   HbA1C: No results found for: HGBA1C CBG: Recent Labs  Lab 11/07/17 2250 11/07/17 2356 11/08/17 0101 11/08/17 0206 11/08/17 0854  GLUCAP 140* 156* 192* 205* 142*    Admitting History of Present Illness.   62 year old insulin-dependent diabetic presented to the emergency room on 9/19 with chief complaint of 2 days shortness of breath after 3 days of  nausea, vomiting, and diarrhea over 5-day.  Denied sick exposures, fever, or  significant abdominal pain.  Had not been taking his insulin in last 2 days.  Initial glucose 302, had a anion gap metabolic acidosis of 26, and mild leukocytosis.  On arrival he was found to be tachypneic, on arrival, but initially saturating 100% on room air.  He was treated with insulin therapy and IV fluid resuscitation.  On reevaluation his oxygen requirements increased, found to have pulse oximetry in the 80s on 2 L, required titration up to high flow and then eventually BiPAP.  Transferred to [facility] for admission and further evaluation and care Review of Systems:   Unable due to work of breathing.  Past medical history  He,  has a past medical history of Diabetes mellitus without complication (HCC), Diabetic nephropathy (HCC), Diabetic neuropathy (HCC), and Essential hypertension.   Surgical History   CABG 2002   Social History   Social History   Socioeconomic History  . Marital status: Married    Spouse name: Not on file  . Number of children: Not on file  . Years of education: Not on file  . Highest education level: Not on file  Occupational History  . Not on file  Social Needs  . Financial resource strain: Not on file  . Food insecurity:    Worry: Not on file    Inability: Not on file  . Transportation needs:    Medical: Not on file    Non-medical: Not on file  Tobacco Use  . Smoking status: Never Smoker  . Smokeless tobacco: Former Engineer, water and Sexual Activity  . Alcohol use: Not Currently  . Drug use: Never  . Sexual activity: Not on file  Lifestyle  . Physical activity:    Days per week: Not on file    Minutes per session: Not on file  . Stress: Not on file  Relationships  . Social connections:    Talks on phone: Not on file    Gets together: Not on file    Attends religious service: Not on file    Active member of club or organization: Not on file    Attends meetings of clubs or organizations: Not on file    Relationship status: Not on file  .  Intimate partner violence:    Fear of current or ex partner: Not on file    Emotionally abused: Not on file    Physically abused: Not on file    Forced sexual activity: Not on file  Other Topics Concern  .  Not on file  Social History Narrative  . Not on file  ,  reports that he has never smoked. He has quit using smokeless tobacco. He reports that he drank alcohol. He reports that he does not use drugs.   Family history   His family history is not on file.   Allergies No Known Allergies  Home meds  Prior to Admission medications   Medication Sig Start Date End Date Taking? Authorizing Provider  aspirin 81 MG chewable tablet Chew 81 mg by mouth daily.   Yes [provider]  insulin glargine (LANTUS) 100 UNIT/ML injection Inject 80 Units into the skin daily.   Yes [provider]  metFORMIN (GLUCOPHAGE) 1000 MG tablet Take 1,000 mg by mouth 2 (two) times daily with a meal.   Yes [provider]  metoprolol succinate (TOPROL-XL) 25 MG 24 hr tablet Take 25 mg by mouth daily.   Yes [provider]    CCT 50 mins  Posey Boyer, AGACNP-BC [facility] Pgr: [phone] or if no answer [phone] 11/08/2017, 10:15 AM

## 2017-11-08 NOTE — Progress Notes (Signed)
Nutrition Follow-up  DOCUMENTATION CODES:   Not applicable  INTERVENTION:  Tube Feeding:  Vital High Protein @ 65 ml/hr Provides 137 g of protein, 1560 kcals, 1310 mL of free water Meets 100% protein needs  TF regimen and propofol at current rate providing 1818 total kcal/day (96 % of kcal needs)  NUTRITION DIAGNOSIS:   Inadequate oral intake related to acute illness, poor appetite as evidenced by NPO status, per patient/family report.  GOAL:   Provide needs based on ASPEN/SCCM guidelines  MONITOR:   Vent status, Labs, Weight trends, TF tolerance  REASON FOR ASSESSMENT:   Ventilator    ASSESSMENT:   62 yo male admitted with anion gap metabolic acidosis with DKA, acute respiratory failure with pulmonary edema, possible aspiration pneumonia. PMH includes DM. HTN  Patient is currently intubated on ventilator support MV: 9.6 L/min Temp (24hrs), Avg:99 F (37.2 C), Min:98 F (36.7 C), Max:99.5 F (37.5 C)  Propofol: 9.8 ml/hr  Family at bedside and indicating pt eating well up until 1 week PTA. Pt last good meal was Saturday 9/14. Pt not eating well or taking meds for several days PTA.  Current wt 82 kg; No other weight encounters. Family denies wt loss  Labs: CBGs 142-166 (at goal for ICU); potassium 3.0 Meds: insulin drip, propofol  NUTRITION - FOCUSED PHYSICAL EXAM:    Most Recent Value  Orbital Region  No depletion  Upper Arm Region  No depletion  Thoracic and Lumbar Region  No depletion  Buccal Region  Unable to assess  Temple Region  No depletion  Clavicle Bone Region  No depletion  Clavicle and Acromion Bone Region  No depletion  Scapular Bone Region  No depletion  Dorsal Hand  No depletion  Patellar Region  No depletion  Anterior Thigh Region  No depletion  Posterior Calf Region  No depletion  Edema (RD Assessment)  None       Diet Order:   Diet Order            Diet NPO time specified  Diet effective now              EDUCATION  NEEDS:   Not appropriate for education at this time  Skin:  Skin Assessment: Reviewed RN Assessment  Last BM:  9/18  Height:   Ht Readings from Last 1 Encounters:  11/07/17 5\' 10"  (1.778 m)    Weight:   Wt Readings from Last 1 Encounters:  11/08/17 82.2 kg    Ideal Body Weight:     BMI:  Body mass index is 26 kg/m.  Estimated Nutritional Needs:   Kcal:  1901 kcals   Protein:  120-150 g  Fluid:  >/= 1.9 L   Romelle Starcher MS, RD, LDN, CNSC [phone] Pager  [phone] Weekend/On-Call Pager

## 2017-11-09 ENCOUNTER — Inpatient Hospital Stay (HOSPITAL_COMMUNITY): Payer: BLUE CROSS/BLUE SHIELD

## 2017-11-09 DIAGNOSIS — I739 Peripheral vascular disease, unspecified: Secondary | ICD-10-CM

## 2017-11-09 DIAGNOSIS — I214 Non-ST elevation (NSTEMI) myocardial infarction: Secondary | ICD-10-CM

## 2017-11-09 LAB — GLUCOSE, CAPILLARY
GLUCOSE-CAPILLARY: 100 mg/dL — AB (ref 70–99)
GLUCOSE-CAPILLARY: 121 mg/dL — AB (ref 70–99)
GLUCOSE-CAPILLARY: 232 mg/dL — AB (ref 70–99)
GLUCOSE-CAPILLARY: 273 mg/dL — AB (ref 70–99)
Glucose-Capillary: 160 mg/dL — ABNORMAL HIGH (ref 70–99)
Glucose-Capillary: 273 mg/dL — ABNORMAL HIGH (ref 70–99)
Glucose-Capillary: 80 mg/dL (ref 70–99)

## 2017-11-09 LAB — HEPARIN LEVEL (UNFRACTIONATED): Heparin Unfractionated: 0.23 IU/mL — ABNORMAL LOW (ref 0.30–0.70)

## 2017-11-09 LAB — CBC
HEMATOCRIT: 32.7 % — AB (ref 39.0–52.0)
HEMOGLOBIN: 10.7 g/dL — AB (ref 13.0–17.0)
MCH: 30.4 pg (ref 26.0–34.0)
MCHC: 32.7 g/dL (ref 30.0–36.0)
MCV: 92.9 fL (ref 78.0–100.0)
Platelets: 339 10*3/uL (ref 150–400)
RBC: 3.52 MIL/uL — ABNORMAL LOW (ref 4.22–5.81)
RDW: 13.2 % (ref 11.5–15.5)
WBC: 11.9 10*3/uL — AB (ref 4.0–10.5)

## 2017-11-09 LAB — MAGNESIUM: MAGNESIUM: 2.5 mg/dL — AB (ref 1.7–2.4)

## 2017-11-09 LAB — PHOSPHORUS: Phosphorus: 3 mg/dL (ref 2.5–4.6)

## 2017-11-09 LAB — PROCALCITONIN: Procalcitonin: 0.33 ng/mL

## 2017-11-09 MED ORDER — ORAL CARE MOUTH RINSE
15.0000 mL | Freq: Two times a day (BID) | OROMUCOSAL | Status: DC
  Administered 2017-11-09: 15 mL via OROMUCOSAL

## 2017-11-09 MED ORDER — INSULIN DETEMIR 100 UNIT/ML ~~LOC~~ SOLN
40.0000 [IU] | Freq: Two times a day (BID) | SUBCUTANEOUS | Status: DC
  Administered 2017-11-09 – 2017-11-10 (×3): 40 [IU] via SUBCUTANEOUS
  Filled 2017-11-09 (×5): qty 0.4

## 2017-11-09 MED ORDER — INSULIN ASPART 100 UNIT/ML ~~LOC~~ SOLN
0.0000 [IU] | Freq: Three times a day (TID) | SUBCUTANEOUS | Status: DC
  Administered 2017-11-09: 3 [IU] via SUBCUTANEOUS
  Administered 2017-11-10 – 2017-11-11 (×3): 2 [IU] via SUBCUTANEOUS
  Administered 2017-11-11: 5 [IU] via SUBCUTANEOUS

## 2017-11-09 MED ORDER — HEPARIN BOLUS VIA INFUSION
2000.0000 [IU] | Freq: Once | INTRAVENOUS | Status: AC
  Administered 2017-11-09: 2000 [IU] via INTRAVENOUS
  Filled 2017-11-09: qty 2000

## 2017-11-09 MED ORDER — METOPROLOL SUCCINATE ER 25 MG PO TB24
25.0000 mg | ORAL_TABLET | Freq: Every day | ORAL | Status: DC
  Administered 2017-11-09 – 2017-11-16 (×8): 25 mg via ORAL
  Filled 2017-11-09 (×8): qty 1

## 2017-11-09 MED ORDER — ROSUVASTATIN CALCIUM 20 MG PO TABS
20.0000 mg | ORAL_TABLET | Freq: Every day | ORAL | Status: DC
  Administered 2017-11-10 – 2017-11-15 (×5): 20 mg via ORAL
  Filled 2017-11-09 (×4): qty 1

## 2017-11-09 MED ORDER — ACETAMINOPHEN 160 MG/5ML PO SOLN
650.0000 mg | Freq: Four times a day (QID) | ORAL | Status: DC | PRN
  Administered 2017-11-15: 650 mg via ORAL
  Filled 2017-11-09 (×2): qty 20.3

## 2017-11-09 MED ORDER — CLOPIDOGREL BISULFATE 75 MG PO TABS
300.0000 mg | ORAL_TABLET | Freq: Once | ORAL | Status: AC
  Administered 2017-11-09: 300 mg via ORAL
  Filled 2017-11-09: qty 4

## 2017-11-09 MED ORDER — CLOPIDOGREL BISULFATE 75 MG PO TABS
75.0000 mg | ORAL_TABLET | Freq: Every day | ORAL | Status: DC
  Administered 2017-11-10 – 2017-11-16 (×7): 75 mg via ORAL
  Filled 2017-11-09 (×7): qty 1

## 2017-11-09 NOTE — Progress Notes (Signed)
Nutrition Follow-up  DOCUMENTATION CODES:   Not applicable  INTERVENTION:  Tube Feeding:  Vital 1.2 @ 65 ml/hr + PS 30ml daily via tube  Provides 132 g of protein, 1972 kcals, 1265 mL of free water  NUTRITION DIAGNOSIS:   Inadequate oral intake related to acute illness, poor appetite as evidenced by NPO status, per patient/family report.  GOAL:   Provide needs based on ASPEN/SCCM guidelines  MONITOR:   Vent status, Labs, Weight trends, TF tolerance  REASON FOR ASSESSMENT:   Ventilator    ASSESSMENT:   62 yo male admitted with anion gap metabolic acidosis with DKA, acute respiratory failure with pulmonary edema, possible aspiration pneumonia. PMH includes DM. HTN  Medications reviewed and include: aspirin, plavix, insulin   Labs reviewed: P 3.0 wnl, Mg 2.5(H) cbgs- 273, 232 x 24hrs  Patient is currently intubated on ventilator support MV: 16 L/min Temp (24hrs), Avg:99.4 F (37.4 C), Min:98.2 F (36.8 C), Max:100.3 F (37.9 C)  MAP- >43mmHg  Diet Order:   Diet Order            Diet NPO time specified Except for: Ice Chips  Diet effective now             EDUCATION NEEDS:   Not appropriate for education at this time  Skin:  Skin Assessment: Reviewed RN Assessment  Last BM:  9/18  Height:   Ht Readings from Last 1 Encounters:  11/07/17 5\' 10"  (1.778 m)    Weight:   Wt Readings from Last 1 Encounters:  11/09/17 82.4 kg    Ideal Body Weight:     BMI:  Body mass index is 26.07 kg/m.  Estimated Nutritional Needs:   Kcal:  1901 kcals   Protein:  120-150 g  Fluid:  >/= 1.9 L  Betsey Holiday MS, RD, LDN Pager #- [phone] Office#- [phone] After Hours Pager: [phone]

## 2017-11-09 NOTE — Progress Notes (Signed)
NAME:  Brian Miller, MRN:  [id number], DOB:  [birth year], LOS: 2 ADMISSION DATE:  11/07/2017, CONSULTATION DATE: 9/19 REFERRING MD:  Lockie Mola, CHIEF COMPLAINT: Diabetic ketoacidosis  Brief History   62 year old diabetic presented to [facility] med center on 9/19 with nausea vomiting and diarrhea followed by 2 days of progressive cough and shortness of breath.   Admitted for DKA. Intubated for progressive renal failure.    Ruled in for myocardial injury in context of previously known CAD prior CABG associated with EKG changes and possible inferoseptal wall motion abnormalities.  9/20 AG closed with fluids and insulin infusion. Failed SBT and was diuresed.   Consults: date of consult/date signed off & final recs:  Cardiology 9/19: inferior and lateral T wave inversion concern about ischemia   Subjective:  Patient denies CP, pain, or SOB Off sedation and patient passed SBT.  Denies dyspnea.  Objective   Blood pressure (!) 133/55, pulse 75, temperature 98 F (36.7 C), temperature source Oral, resp. rate (!) 34, height 5\' 10"  (1.778 m), weight 82.4 kg, SpO2 90 %.    Vent Mode: PSV FiO2 (%):  [36 %-40 %] 36 % Set Rate:  [12 bmp] 12 bmp Vt Set:  [580 mL] 580 mL PEEP:  [5 cmH20] 5 cmH20 Pressure Support:  [5 cmH20] 5 cmH20 Plateau Pressure:  [24 cmH20] 24 cmH20   Intake/Output Summary (Last 24 hours) at 11/09/2017 2056 Last data filed at 11/09/2017 1600 Gross per 24 hour  Intake 2568.06 ml  Output 1430 ml  Net 1138.06 ml   Filed Weights   11/07/17 1300 11/08/17 1300 11/09/17 0224  Weight: 82 kg 82.2 kg 82.4 kg    Examination: General:  Critically ill adult male. Well nourished. HEENT: MM pink/moist, pupils 3/reactive, ETT/ OGT Neuro: Awake, f/c, MAE, writing to communicate  CV: , no murmur, +2 pulses PULM: even/non-labored on MV, breathing over set rate, lungs bilaterally clear GI: soft, non-tender, bs active  Extremities: warm/dry, no edema  Skin: no rashes   Resolved  Hospital Problem list    Assessment & Plan:   Anion gap metabolic acidosis. Mixed diabetic ketoacidosis further complicated by lactic acidosis from respiratory failure. Now resolved Transitioned successfully to [place] insulin. Increase insulin as diet progressed.  Acute hypoxic respiratory failure: Secondary to pulmonary edema and r/o aspiration PNA Passed SBT. Extubated successfully. Progressive ambulation.  NSTEACS. Pattern of troponin elevation most compatible with demand related type 2 injury. Completed 48h of heparin. ASA+Plavix Beta-blocker resumed. Add Lipitor Plan non-invasive risk stratification with pharmacological stress perfusion.  Acute kidney injury. Resolved and currently euvolemic clinically. Further diuresis only if not weaning off O2  Diabetes with DKA Plan AG closed DKA protocol transitioned from IV insulin to SSI Consult diabetes coordinator   Disposition / Summary of Today's Plan 11/09/17   Improving. Extubated. Can transfer out of ICU tomorrow.    Diet: Progressive diet Pain/Anxiety/Delirium protocol (if indicated): off. VAP protocol (if indicated)  Extubated. DVT prophylaxis: ASA+ Plavix GI prophylaxis:H2 Blockade Hyperglycemia protocol: Resumed home insulin. Mobility: ad lib Code Status: full code Family Communication: no family at bedside.  Patient updated on plan of care   Labs   CBC: improving leukocytosis.  Basic Metabolic Panel: Mild acidosis, hypokalemia -repleted. Lactic acidosis resolved.  Cardiac Enzymes: mild troponin rise compatible with demand related injury.  [id number] glycemic control  CRITICAL CARE Performed by: Lynnell Catalan   Total critical care time: 30 minutes  Critical care time was exclusive of separately billable procedures and treating other patients.  Critical care was necessary to treat or prevent imminent or life-threatening deterioration.  Critical care was time sent personally by me on the following  activities: development of treatment plan with patient and/or surrogate as well as nursing, discussions with consultants, evaluation of patient's response to treatment, examination of patient, obtaining history from patient or surrogate, ordering and performing treatments and interventions, ordering and review of laboratory studies, ordering and review of radiographic studies, pulse oximetry and re-evaluation of patient's condition.  Lynnell Catalan, MD [facility] ICU Physician [facility]  Pager: [phone] Mobile: [phone] After hours: [phone].  11/09/2017, 8:56 PM

## 2017-11-09 NOTE — Progress Notes (Signed)
Patient extubated per MD's order, at 9:09 AM, no stridor herar patient is able to vocalize, placed on 4LNC with STAS of 94%, will continue to monitor patient.

## 2017-11-09 NOTE — Progress Notes (Signed)
Subjective:  Had turnaround in his respiratory status since yesterday.  Currently not short of breath and no complaints of chest pain.  Breathing is much better today.  Objective:  Vital Signs in the last 24 hours: BP (!) 132/59   Pulse 83   Temp 98.2 F (36.8 C) (Oral)   Resp (!) 29   Ht 5\' 10"  (1.778 m)   Wt 82.4 kg   SpO2 94%   BMI 26.07 kg/m   Physical Exam: Pleasant male lying in bed currently on oxygen  lungs: Minimal rales right base  cardiac:  Regular rhythm, normal S1 and S2, no S3 Extremities:  No edema present  Intake/Output from previous day: 09/20 0701 - 09/21 0700 In: 2074 [I.V.:924.1; NG/GT:780; IV Piggyback:369.9] Out: 1695 [Urine:1695]  Weight Filed Weights   11/07/17 1300 11/08/17 1300 11/09/17 0224  Weight: 82 kg 82.2 kg 82.4 kg    Lab Results: Basic Metabolic Panel: Recent Labs    11/07/17 1744 11/08/17 0753  NA 135 136  K 3.6 3.0*  CL 103 107  CO2 18* 19*  GLUCOSE 203* 181*  BUN 43* 43*  CREATININE 1.29* 1.17   CBC: Recent Labs    11/07/17 0947 11/08/17 0348 11/09/17 0500  WBC 13.7* 12.5* 11.9*  NEUTROABS 11.5*  --   --   HGB 15.3 11.0* 10.7*  HCT 44.2 31.7* 32.7*  MCV 88.9 88.5 92.9  PLT 436* 334 339   Cardiac Enzymes: Troponin (Point of Care Test) No results for input(s): TROPIPOC in the last 72 hours. Cardiac Panel (last 3 results) Recent Labs    11/07/17 1744 11/08/17 0004 11/08/17 0753  TROPONINI 1.77* 2.00* 1.42*    Telemetry: Sinus rhythm  Assessment/Plan:  1.  Non-STEMI likely due to demand ischemia in the setting of severe systemic illness 2.  CAD with previous stenting of a vein graft to the circumflex 3.  DKA 4.  Acute kidney injury which is improving  Recommendations:  He is still recovering from his acute respiratory failure.  Would watch over the weekend in terms of recovery of renal function.  Consider either repeat cath or other risk stratification down the road.     Darden Palmer   MD [facility] Cardiology  11/09/2017, 10:51 AM

## 2017-11-09 NOTE — Progress Notes (Signed)
Inpatient Diabetes Program Recommendations  AACE/ADA: New Consensus Statement on Inpatient Glycemic Control (2019)  Target Ranges:  Prepandial:   less than 140 mg/dL      Peak postprandial:   less than 180 mg/dL (1-2 hours)      Critically ill patients:  140 - 180 mg/dL   Results for Brian Miller, Brian Miller (MRN [id number]) as of 11/09/2017 07:52  Ref. Range 11/08/2017 10:54 11/08/2017 11:38 11/08/2017 13:07 11/08/2017 14:08 11/08/2017 15:48 11/08/2017 18:39 11/08/2017 20:05 11/08/2017 23:59 11/09/2017 03:42 11/09/2017 07:42  Glucose-Capillary Latest Ref Range: 70 - 99 mg/dL 026 (H) 378 (H) 588 (H) 201 (H) 186 (H) 240 (H) 277 (H) 273 (H) 273 (H) 232 (H)   Review of Glycemic Control  Diabetes history: DM2 Outpatient Diabetes medications: Jardiance 25 mg daily, Amaryl 2 mg BID,Lantus 80 units daily, Metformin 1000 mg BID Current orders for Inpatient glycemic control: Levemir 25 units BID, Novolog 0-15 units Q4H, Novolog 7 units Q4H for tube feeding coverage  Inpatient Diabetes Program Recommendations:  Insulin - Basal: Please consider increasing Levemir to 35 units BID. Correction (SSI): Please consider increasing Novolog correction 0-20 units Q4H. HgbA1C: A1C 7.9% on 11/08/17 indicating an average glucose of 180 mg/dl over the past 2-3 months.  NOTE: If the above recommendations are made and glucose continues to be consistently greater than 200 mg/dl, please discontinue all insulin orders and use ICU Glycemic Control Phase 2 IV insulin to improve glycemic control safely.  Thanks, Orlando Penner, RN, MSN, CDE Diabetes Coordinator Inpatient Diabetes Program [phone] (Team Pager from 8am to 5pm)

## 2017-11-09 NOTE — Progress Notes (Signed)
Pt ambulated to bathroom without oxygen on and maintained a saturation on 91%. Pt stated he felt fine during the whole process, denies any dizziness or weakness.

## 2017-11-09 NOTE — Progress Notes (Signed)
ANTICOAGULATION CONSULT NOTE - Follow Up Consult  Pharmacy Consult for heparin Indication: ACS  Labs: Recent Labs    11/07/17 0947  11/07/17 1353 11/07/17 1744 11/08/17 0004  11/08/17 0348 11/08/17 0753 11/08/17 1618 11/09/17 0500 11/09/17 0501  HGB 15.3  --   --   --   --   --  11.0*  --   --  10.7*  --   HCT 44.2  --   --   --   --   --  31.7*  --   --  32.7*  --   PLT 436*  --   --   --   --   --  334  --   --  339  --   HEPARINUNFRC  --   --   --   --   --    < >  --  <0.10* 0.25*  --  0.23*  CREATININE 1.41*  --  1.59* 1.29*  --   --   --  1.17  --   --   --   TROPONINI  --    < > 0.59* 1.77* 2.00*  --   --  1.42*  --   --   --    < > = values in this interval not displayed.    Assessment: 62yo male remains subtherapeutic on heparin after rate increases; RN reports no gtt issues or signs of bleeding.  Goal of Therapy:  Heparin level 0.3-0.7 units/ml   Plan:  Will rebolus with heparin 2000 units and increase heparin gtt by 2-3 units/kg/hr to 1900 units/hr and check level in 6 hours.    Vernard Gambles, PharmD, BCPS  11/09/2017,5:39 AM

## 2017-11-09 NOTE — Progress Notes (Signed)
Pt was placed back on oxygen due to pt stating he felt SOB while eating lunch.

## 2017-11-09 NOTE — Progress Notes (Signed)
Pt transferred to chair no problem. Pt passed [facility] stroke swallow evaluation. Pt is drinking water currently with no complaints. O2 bumped down to 2L [place].

## 2017-11-10 DIAGNOSIS — I249 Acute ischemic heart disease, unspecified: Secondary | ICD-10-CM

## 2017-11-10 LAB — CBC WITH DIFFERENTIAL/PLATELET

## 2017-11-10 LAB — DIFFERENTIAL
Abs Immature Granulocytes: 0.1 10*3/uL (ref 0.0–0.1)
BASOS ABS: 0.1 10*3/uL (ref 0.0–0.1)
BASOS PCT: 1 %
EOS ABS: 0.4 10*3/uL (ref 0.0–0.7)
EOS PCT: 3 %
IMMATURE GRANULOCYTES: 0 %
LYMPHS ABS: 1.7 10*3/uL (ref 0.7–4.0)
Lymphocytes Relative: 14 %
Monocytes Absolute: 1.4 10*3/uL — ABNORMAL HIGH (ref 0.1–1.0)
Monocytes Relative: 11 %
NEUTROS PCT: 71 %
Neutro Abs: 8.7 10*3/uL — ABNORMAL HIGH (ref 1.7–7.7)

## 2017-11-10 LAB — GLUCOSE, CAPILLARY
GLUCOSE-CAPILLARY: 102 mg/dL — AB (ref 70–99)
GLUCOSE-CAPILLARY: 148 mg/dL — AB (ref 70–99)
Glucose-Capillary: 115 mg/dL — ABNORMAL HIGH (ref 70–99)
Glucose-Capillary: 128 mg/dL — ABNORMAL HIGH (ref 70–99)
Glucose-Capillary: 140 mg/dL — ABNORMAL HIGH (ref 70–99)

## 2017-11-10 LAB — CBC
HCT: 31.4 % — ABNORMAL LOW (ref 39.0–52.0)
Hemoglobin: 10.3 g/dL — ABNORMAL LOW (ref 13.0–17.0)
MCH: 30.2 pg (ref 26.0–34.0)
MCHC: 32.8 g/dL (ref 30.0–36.0)
MCV: 92.1 fL (ref 78.0–100.0)
Platelets: 350 10*3/uL (ref 150–400)
RBC: 3.41 MIL/uL — ABNORMAL LOW (ref 4.22–5.81)
RDW: 13 % (ref 11.5–15.5)
WBC: 12.3 10*3/uL — AB (ref 4.0–10.5)

## 2017-11-10 LAB — BASIC METABOLIC PANEL
ANION GAP: 10 (ref 5–15)
BUN: 28 mg/dL — ABNORMAL HIGH (ref 8–23)
CALCIUM: 7.5 mg/dL — AB (ref 8.9–10.3)
CO2: 21 mmol/L — ABNORMAL LOW (ref 22–32)
Chloride: 107 mmol/L (ref 98–111)
Creatinine, Ser: 0.85 mg/dL (ref 0.61–1.24)
GFR calc Af Amer: >60 mL/min (ref >60)
Glucose, Bld: 111 mg/dL — ABNORMAL HIGH (ref 70–99)
Potassium: 3.4 mmol/L — ABNORMAL LOW (ref 3.5–5.1)
Sodium: 138 mmol/L (ref 135–145)

## 2017-11-10 MED ORDER — POTASSIUM CHLORIDE CRYS ER 20 MEQ PO TBCR
40.0000 meq | EXTENDED_RELEASE_TABLET | Freq: Once | ORAL | Status: AC
  Administered 2017-11-10: 40 meq via ORAL
  Filled 2017-11-10: qty 2

## 2017-11-10 MED ORDER — POTASSIUM CHLORIDE 20 MEQ PO PACK
40.0000 meq | PACK | Freq: Once | ORAL | Status: AC
  Administered 2017-11-10: 40 meq via ORAL
  Filled 2017-11-10: qty 2

## 2017-11-10 MED ORDER — GUAIFENESIN 100 MG/5ML PO SOLN
5.0000 mL | ORAL | Status: DC | PRN
  Administered 2017-11-10 (×2): 100 mg via ORAL
  Filled 2017-11-10 (×2): qty 5

## 2017-11-10 MED ORDER — FUROSEMIDE 10 MG/ML IJ SOLN
40.0000 mg | Freq: Once | INTRAMUSCULAR | Status: AC
  Administered 2017-11-10: 40 mg via INTRAVENOUS
  Filled 2017-11-10: qty 4

## 2017-11-10 NOTE — Progress Notes (Signed)
Subjective:  Coughing significantly with most any talking or other activity.  No complaints of chest pain or shortness of breath.  Mildly hypokalemic.  Renal function is improved.  Objective:  Vital Signs in the last 24 hours: BP (!) 143/59 (BP Location: Left Arm)   Pulse 70   Temp 98.6 F (37 C) (Oral)   Resp 16   Ht 5\' 10"  (1.778 m)   Wt 82.9 kg   SpO2 93%   BMI 26.22 kg/m   Physical Exam: Pleasant male lying in bed currently on oxygen  lungs: Minimal rales right base  cardiac:  Regular rhythm, normal S1 and S2, no S3 Extremities:  No edema present  Intake/Output from previous day: 09/21 0701 - 09/22 0700 In: 1478 [P.O.:1440; I.V.:38] Out: 750 [Urine:750]  Weight Filed Weights   11/08/17 1300 11/09/17 0224 11/10/17 0500  Weight: 82.2 kg 82.4 kg 82.9 kg    Lab Results: Basic Metabolic Panel: Recent Labs    11/08/17 0753 11/10/17 0303  NA 136 138  K 3.0* 3.4*  CL 107 107  CO2 19* 21*  GLUCOSE 181* 111*  BUN 43* 28*  CREATININE 1.17 0.85   CBC: Recent Labs    11/10/17 0303 11/10/17 0845  WBC 12.3* DUPLICATE  REFER [id number]  NEUTROABS  --  PENDING  HGB 10.3* DUPLICATE  REFER [id number]  HCT 31.4* DUPLICATE  REFER X10552  MCV 92.1 DUPLICATE  REFER X10552  PLT 350 DUPLICATE  REFER [id number]   Cardiac Enzymes: Troponin (Point of Care Test) No results for input(s): TROPIPOC in the last 72 hours. Cardiac Panel (last 3 results) Recent Labs    11/07/17 1744 11/08/17 0004 11/08/17 0753  TROPONINI 1.77* 2.00* 1.42*    Telemetry: Sinus rhythm  Assessment/Plan:  1.  Non-STEMI likely due to demand ischemia in the setting of severe systemic illness 2.  CAD with previous stenting of a vein graft to the circumflex 3.  DKA 4.  Acute kidney injury which is improving and now back at baseline. Recommendations:  Renal function has recovered and he appears to be getting better from his respiratory status although he is still severely coughing.  I would favor  evaluation with catheterization prior to discharge but will need to be sure that he can lay flat and is not coughing prior to doing this.  May be considering changing cardiology care to [facility].  We will keep n.p.o. for possible catheterization tomorrow.   Brian Palmer  MD [facility]  11/10/2017, 10:54 AM

## 2017-11-10 NOTE — Progress Notes (Addendum)
NAME:  Brian Miller, MRN:  [id number], DOB:  January 23, 1956, LOS: 3 ADMISSION DATE:  11/07/2017, CONSULTATION DATE: 9/19 REFERRING MD:  Lockie Mola, CHIEF COMPLAINT: Diabetic ketoacidosis  Brief History   62 year old diabetic presented to [facility] med center on 9/19 with nausea vomiting and diarrhea followed by 2 days of progressive cough and shortness of breath.   Admitted for DKA. Intubated for progressive renal failure.    Ruled in for myocardial injury in context of previously known CAD prior CABG associated with EKG changes and possible inferoseptal wall motion abnormalities.  9/20 AG closed with fluids and insulin infusion. Failed SBT and was diuresed.  9/21 Successfully extubated.   Consults: date of consult/date signed off & final recs:  Cardiology 9/19: inferior and lateral T wave inversion concern about ischemia   Subjective:  Patient denies CP, pain, or SOB  On further questioning patient reports he has been experiencing exertional shortness of breath and associated dry cough for approximately the last year.  He developed a particularly significant episode 5 days prior to admission while playing golf.  He continued to feel unwell for the remainder of the week prior to admission with episodes of diaphoresis.  Objective   Blood pressure 126/60, pulse (!) 58, temperature 98.3 F (36.8 C), temperature source Oral, resp. rate (!) 30, height 5\' 10"  (1.778 m), weight 82.9 kg, SpO2 95 %.    FiO2 (%):  [36 %] 36 %   Intake/Output Summary (Last 24 hours) at 11/10/2017 0833 Last data filed at 11/09/2017 1600 Gross per 24 hour  Intake 1458.98 ml  Output 300 ml  Net 1158.98 ml   Filed Weights   11/08/17 1300 11/09/17 0224 11/10/17 0500  Weight: 82.2 kg 82.4 kg 82.9 kg    Examination: General: Calm well-nourished with no distress. HEENT: MM pink/moist, no scleral icterus or pallor. Neuro: Awake and neurologically intact. CV: , JVP is at 3 cm above sternal angle with a positive  HJR.  No peripheral edema.  Apex beat is not displaced or sustained first and second heart sounds are unremarkable no murmurs were appreciated.  The patient has an S4 gallop.  Peripheral pulses are intact. PULM: Breathing is unlabored.  Chest is clear to auscultation bilaterally. GI: soft, non-tender Extremities: warm/dry, no edema.  Amputated right second toe.  Sensation is normal.  Foot arches are well preserved. Skin: no rashes line sites are intact.  Resolved Hospital Problem list   Acute respiratory failure. Assessment & Plan:    NSTEACS.  While the pattern of troponin elevation seems suggestive of demand related infarction, the patient's clinical history is compatible with an anginal equivalent shortness of breath.  Thus obstructive coronary disease causing pulmonary edema should be ruled out. Completed 48h of heparin. ASA+Plavix Beta-blocker resumed. Resumed home Crestor Plan non-invasive risk stratification with pharmacological stress perfusion with possible coronary angiography subsequently.  Acute kidney injury. Resolved and currently euvolemic clinically.  Diabetes with DKA Plan AG closed DKA protocol transitioned from IV insulin to SSI with good control. Consult diabetes coordinator   Disposition / Summary of Today's Plan 11/10/17   Ready for transfer to telemetry.  Coronary risk stratification.      Diet: Progressive diet Pain/Anxiety/Delirium protocol (if indicated): off. VAP protocol (if indicated)  Extubated. DVT prophylaxis: ASA+ Plavix GI prophylaxis:H2 Blockade Hyperglycemia protocol: Resumed home insulin. Mobility: ad lib Code Status: full code Family Communication: no family at bedside.  Patient updated on plan of care   Lab and ancillary testing (personally reviewed)  CBC:  improving leukocytosis.  Basic Metabolic Panel: Mild acidosis, hypokalemia -repleted. Lactic acidosis resolved.  Cardiac Enzymes: mild troponin rise compatible with demand  related injury.  EAV:[id number] glycemic control, reasonable glycemic control at baseline with hemoglobin A1c 7.4.  Serial EKG's show dynamic anterior ST/T wave changes.  Initial echo performed 9/19 showed low normal LV function subtle wall motion abnormalities which improved on repeat POC imaging.  Lynnell Catalan, MD [facility] ICU Physician [facility] [place] [facility]  Pager: [phone] Mobile: [phone] After hours: [phone].  11/10/2017, 8:33 AM

## 2017-11-10 NOTE — Progress Notes (Signed)
eLink Physician-Brief Progress Note Patient Name: Brian Miller DOB: 11-23-1955 MRN: [id number]   Date of Service  11/10/2017  HPI/Events of Note  K+ = 3.4 and Creatinine = 0.85.   eICU Interventions  Will replace K+.     Intervention Category Major Interventions: Electrolyte abnormality - evaluation and management  Sommer,Steven Eugene 11/10/2017, 6:51 AM

## 2017-11-10 NOTE — Progress Notes (Signed)
Inpatient Diabetes Program Recommendations  AACE/ADA: New Consensus Statement on Inpatient Glycemic Control (2019)  Target Ranges:  Prepandial:   less than 140 mg/dL      Peak postprandial:   less than 180 mg/dL (1-2 hours)      Critically ill patients:  140 - 180 mg/dL  Results for QUAMARI, WETSEL (MRN [id number]) as of 11/10/2017 08:43  Ref. Range 11/09/2017 07:42 11/09/2017 11:55 11/09/2017 15:27 11/09/2017 19:32 11/09/2017 23:46 11/10/2017 03:32 11/10/2017 07:38  Glucose-Capillary Latest Ref Range: 70 - 99 mg/dL 290 (H)  Novolog 12 units  Levemir 40 units @ 9:17 160 (H)  Novolog 3 units 100 (H) 80 121 (H) 102 (H) 115 (H)    Review of Glycemic Control Diabetes history: DM2 Outpatient Diabetes medications: Jardiance 25 mg daily, Amaryl 2 mg BID,Lantus 80 units daily, Metformin 1000 mg BID Current orders for Inpatient glycemic control: Levemir 40 units BID, Novolog 0-15 units TID with meals  Inpatient Diabetes Program Recommendations:  Insulin - Basal: Bedtime dose of Levemir was NOT given last night.  Please consider decreasing Levemir to 35 units daily (starting this morning). Correction (SSI): Please consider ordering Novolog 0-5 units QHS for bedtime correction scale.   NOTE: In reviewing chart, noted patient only received ONE dose of Levemir 40 units on 11/09/17 and fasting glucose 115 mg/dl this morning. Patient was extubated yesterday morning at 9:09 am and glucose has improve significantly since then.  Thanks, Orlando Penner, RN, MSN, CDE Diabetes Coordinator Inpatient Diabetes Program [phone] (Team Pager from 8am to 5pm)

## 2017-11-11 ENCOUNTER — Inpatient Hospital Stay (HOSPITAL_COMMUNITY): Payer: BLUE CROSS/BLUE SHIELD

## 2017-11-11 DIAGNOSIS — I2510 Atherosclerotic heart disease of native coronary artery without angina pectoris: Secondary | ICD-10-CM

## 2017-11-11 LAB — CBC
HCT: 32.4 % — ABNORMAL LOW (ref 39.0–52.0)
Hemoglobin: 10.8 g/dL — ABNORMAL LOW (ref 13.0–17.0)
MCH: 30.7 pg (ref 26.0–34.0)
MCHC: 33.3 g/dL (ref 30.0–36.0)
MCV: 92 fL (ref 78.0–100.0)
PLATELETS: 381 10*3/uL (ref 150–400)
RBC: 3.52 MIL/uL — ABNORMAL LOW (ref 4.22–5.81)
RDW: 12.9 % (ref 11.5–15.5)
WBC: 11.9 10*3/uL — ABNORMAL HIGH (ref 4.0–10.5)

## 2017-11-11 LAB — BASIC METABOLIC PANEL
Anion gap: 8 (ref 5–15)
BUN: 17 mg/dL (ref 8–23)
CO2: 25 mmol/L (ref 22–32)
CREATININE: 0.73 mg/dL (ref 0.61–1.24)
Calcium: 7.8 mg/dL — ABNORMAL LOW (ref 8.9–10.3)
Chloride: 104 mmol/L (ref 98–111)
GFR calc Af Amer: >60 mL/min (ref >60)
GLUCOSE: 45 mg/dL — AB (ref 70–99)
POTASSIUM: 3.3 mmol/L — AB (ref 3.5–5.1)
SODIUM: 137 mmol/L (ref 135–145)

## 2017-11-11 LAB — GLUCOSE, CAPILLARY
GLUCOSE-CAPILLARY: 155 mg/dL — AB (ref 70–99)
GLUCOSE-CAPILLARY: 205 mg/dL — AB (ref 70–99)
GLUCOSE-CAPILLARY: 264 mg/dL — AB (ref 70–99)
Glucose-Capillary: 35 mg/dL — CL (ref 70–99)
Glucose-Capillary: 125 mg/dL — ABNORMAL HIGH (ref 70–99)

## 2017-11-11 LAB — BRAIN NATRIURETIC PEPTIDE: B NATRIURETIC PEPTIDE 5: 557.8 pg/mL — AB (ref 0.0–100.0)

## 2017-11-11 MED ORDER — INSULIN DETEMIR 100 UNIT/ML ~~LOC~~ SOLN
30.0000 [IU] | Freq: Two times a day (BID) | SUBCUTANEOUS | Status: DC
  Filled 2017-11-11: qty 0.3

## 2017-11-11 MED ORDER — INSULIN DETEMIR 100 UNIT/ML ~~LOC~~ SOLN
30.0000 [IU] | Freq: Two times a day (BID) | SUBCUTANEOUS | Status: DC
  Administered 2017-11-12 – 2017-11-15 (×5): 30 [IU] via SUBCUTANEOUS
  Filled 2017-11-11 (×9): qty 0.3

## 2017-11-11 MED ORDER — POTASSIUM CHLORIDE 10 MEQ/100ML IV SOLN
10.0000 meq | INTRAVENOUS | Status: AC
  Administered 2017-11-11 (×3): 10 meq via INTRAVENOUS
  Filled 2017-11-11 (×3): qty 100

## 2017-11-11 MED ORDER — BENZONATATE 100 MG PO CAPS
200.0000 mg | ORAL_CAPSULE | Freq: Three times a day (TID) | ORAL | Status: DC
  Administered 2017-11-11 – 2017-11-16 (×15): 200 mg via ORAL
  Filled 2017-11-11 (×15): qty 2

## 2017-11-11 MED ORDER — POTASSIUM CHLORIDE CRYS ER 20 MEQ PO TBCR
40.0000 meq | EXTENDED_RELEASE_TABLET | Freq: Once | ORAL | Status: AC
  Administered 2017-11-11: 40 meq via ORAL
  Filled 2017-11-11: qty 2

## 2017-11-11 MED ORDER — HEPARIN SODIUM (PORCINE) 5000 UNIT/ML IJ SOLN
5000.0000 [IU] | Freq: Three times a day (TID) | INTRAMUSCULAR | Status: DC
  Administered 2017-11-11 (×2): 5000 [IU] via SUBCUTANEOUS
  Filled 2017-11-11 (×3): qty 1

## 2017-11-11 MED ORDER — DEXTROSE 50 % IV SOLN
INTRAVENOUS | Status: AC
  Filled 2017-11-11: qty 50

## 2017-11-11 MED ORDER — DEXTROSE 50 % IV SOLN
25.0000 mL | Freq: Once | INTRAVENOUS | Status: AC
  Administered 2017-11-11: 25 mL via INTRAVENOUS

## 2017-11-11 MED ORDER — FUROSEMIDE 10 MG/ML IJ SOLN
40.0000 mg | Freq: Once | INTRAMUSCULAR | Status: AC
  Administered 2017-11-11: 40 mg via INTRAVENOUS
  Filled 2017-11-11: qty 4

## 2017-11-11 MED ORDER — GLUCERNA SHAKE PO LIQD
237.0000 mL | Freq: Three times a day (TID) | ORAL | Status: DC
  Administered 2017-11-11 – 2017-11-16 (×6): 237 mL via ORAL
  Filled 2017-11-11 (×7): qty 237

## 2017-11-11 MED ORDER — GUAIFENESIN-CODEINE 100-10 MG/5ML PO SOLN
10.0000 mL | Freq: Four times a day (QID) | ORAL | Status: DC | PRN
  Administered 2017-11-13 – 2017-11-15 (×5): 10 mL via ORAL
  Filled 2017-11-11 (×8): qty 10

## 2017-11-11 NOTE — Progress Notes (Signed)
Hypoglycemic Event  CBG: 35  Treatment: D50 IV 25 mL  Symptoms: None  Follow-up CBG: NOBS:[id number] CBG Result:155  Possible Reasons for Event: Other: NPO                    Pt was NPO after MN,  Had full dose insulin at hs last night, ? If had a snack   Comments/MD notified: made aware and he changed insulin orders    Harley Fitzwater E

## 2017-11-11 NOTE — Progress Notes (Signed)
Inpatient Diabetes Program Recommendations  AACE/ADA: New Consensus Statement on Inpatient Glycemic Control (2015)  Target Ranges:  Prepandial:   less than 140 mg/dL      Peak postprandial:   less than 180 mg/dL (1-2 hours)      Critically ill patients:  140 - 180 mg/dL   Lab Results  Component Value Date   GLUCAP 155 (H) 11/11/2017   HGBA1C 7.9 (H) 11/08/2017    Review of Glycemic ControlResults for SAMARI, SPILSBURY (MRN [id number]) as of 11/11/2017 11:30  Ref. Range 11/10/2017 11:51 11/10/2017 17:07 11/10/2017 21:24 11/11/2017 07:54 11/11/2017 08:44  Glucose-Capillary Latest Ref Range: 70 - 99 mg/dL 706 (H) 237 (H) 628 (H) 35 (LL) 155 (H)   Diabetes history:DM2 Outpatient Diabetes medications:Jardiance 25 mg daily, Amaryl 2 mg BID,Lantus 80 units daily, Metformin 1000 mg BID Current orders for Inpatient glycemic control:Levemir 30 units BID, Novolog 0-15 units TID with meals Inpatient Diabetes Program Recommendations: Note patient to be NPO after midnight.  Please consider holding tonight's dose of Levemir? Will text page MD.  Thanks,  Beryl Meager, RN, BC-ADM Inpatient Diabetes Coordinator Pager [phone] (8a-5p)

## 2017-11-11 NOTE — Progress Notes (Signed)
Nutrition Follow-up  INTERVENTION:   - Glucerna Shake po TID, each supplement provides 220 kcal and 10 grams of protein  NUTRITION DIAGNOSIS:   Inadequate oral intake related to acute illness, poor appetite as evidenced by NPO status, per patient/family report.  Progressing, pt now on a diet  GOAL:   Patient will meet greater than or equal to 90% of their needs  Proogressing  MONITOR:   PO intake, Supplement acceptance, Labs, Weight trends, I & O's  ASSESSMENT:   62 yo male admitted with anion gap metabolic acidosis with DKA, acute respiratory failure with pulmonary edema, possible aspiration pneumonia. PMH includes DM. HTN  9/21 - extubated  Noted plan for likely cardiac cath tomorrow.  Spoke with pt and family member at bedside. Pt is very SOB and began coughing when answering RD questions. Pt states that his appetite is "so-so." Pt believes it is improving and is willing to try an oral nutrition supplement to maximize kcal and protein intake.  Weight up slightly since admission (180 to 182 lbs).  Meal Completion: 50-100%  Medications reviewed and include: sliding scale Novolog, 30 units Levemir BID, 40 mEq K-dur once, 10 mEq KCl x 3 runs today  Labs reviewed: potassium 3.3 (L) - being replaced, hemoglobin 10.8 (L), HCT 32.4 (L) CBG's: 155, 35, 140, 148 x 24 hours  Diet Order:   Diet Order            Diet NPO time specified  Diet effective midnight        Diet Heart Room service appropriate? Yes; Fluid consistency: Thin  Diet effective now              EDUCATION NEEDS:   Not appropriate for education at this time  Skin:  Skin Assessment: Reviewed RN Assessment  Last BM:  11/10/17  Height:   Ht Readings from Last 1 Encounters:  11/07/17 5\' 10"  (1.778 m)    Weight:   Wt Readings from Last 1 Encounters:  11/11/17 82.6 kg    Ideal Body Weight:  75.45 kg  BMI:  Body mass index is 26.13 kg/m.  Estimated Nutritional Needs:   Kcal:   2100-2300  Protein:  115-130 grams  Fluid:  2.1-2.3 L    Earma Reading, MS, RD, LDN Inpatient Clinical Dietitian Pager: [phone] Weekend/After Hours: [phone]

## 2017-11-11 NOTE — Progress Notes (Signed)
@IPLOG @        PROGRESS NOTE                                                                                                                                                                                                             Patient Demographics:    Brian Miller, is a 62 y.o. male, DOB - September 24, 1955, KPV:[id number]  Admit date - 11/07/2017   Admitting Physician Alyson Reedy, MD  Outpatient Primary MD for the patient is Haimes, Teena Irani, MD  LOS - 4  Chief Complaint  Patient presents with  . Shortness of Breath       Brief Narrative -  62 year old Caucasian gentleman with history of DM2, HTN, dyslipidemia who was admitted to the hospital by pulmonary critical care due to shortness of breath, was found to have an STEMI with acute on chronic diastolic CHF, required intubation, post extubation he developed unrelenting cough, chest x-ray shows pleural effusions bilaterally with right-sided being loculated, he was also seen by cardiology initially stabilized extubated and was transferred to my care on 11/11/2017 under hospitalist service.   Subjective:    Brian Miller today has, No headache, No chest pain, No abdominal pain - No Nausea, No new weakness tingling or numbness, has persistent dry cough along with shortness of breath and orthopnea   Assessment  & Plan :     1.  Acute hypoxic respiratory failure requiring intubation caused by acute on chronic diastolic CHF, NSTEMI.  Cardiology on board, still has evidence of fluid overload, BNP is elevated, will add IV Lasix, continue beta-blocker, continue dual antiplatelet therapy along with statin for secondary prevention.  Likely left heart catheterization tomorrow.  Echocardiogram noted.  2.  Dyslipidemia.  On statin continue.  3.  Hypertension.  Currently on beta-blocker and diuretic, PRN hydralazine added will monitor.   4.  ARF.  Resolved.   5.  Bilateral pleural effusions.  Likely due to CHF, right-sided is loculated,  obtain 2 view x-ray, if symptom-free outpatient pulmonary follow-up.  6. DM type II.  Was in DKA on admission.  Currently on Lantus and sliding scale, dose reduced as he was hypoglycemic this morning, with diet on board will continue to monitor and adjust.  Lab Results  Component Value Date   HGBA1C 7.9 (H) 11/08/2017   CBG (last 3)  Recent Labs    11/10/17 2124 11/11/17 0754 11/11/17 0844  GLUCAP 140* 35* 155*     Family Communication  :  None  Code Status :  Full  Disposition Plan  :  Tele  Consults  :  Cards, PCCM  Procedures  :    TTE  - Left ventricle: The cavity size was normal. Wall thickness was ncreased in a pattern of mild LVH. Systolic function was normal.   The estimated ejection fraction was in the range of 55% to 60%.  Aortic valve: There was mild regurgitation.  Mitral valve: Calcified annulus. There was mild regurgitation.  DVT Prophylaxis  :    Heparin   Lab Results  Component Value Date   PLT 381 11/11/2017    Diet :  Diet Order            Diet NPO time specified  Diet effective midnight        Diet Heart Room service appropriate? Yes; Fluid consistency: Thin  Diet effective now               Inpatient Medications Scheduled Meds: . aspirin  81 mg Per Tube Daily  . benzonatate  200 mg Oral TID  . clopidogrel  75 mg Oral Daily  . dextrose      . furosemide  40 mg Intravenous Once  . Influenza vac split quadrivalent PF  0.5 mL Intramuscular Tomorrow-1000  . insulin aspart  0-15 Units Subcutaneous TID WC  . insulin detemir  30 Units Subcutaneous Q12H  . metoprolol succinate  25 mg Oral Daily  . rosuvastatin  20 mg Oral q1800   Continuous Infusions: . potassium chloride 10 mEq (11/11/17 1056)   PRN Meds:.acetaminophen (TYLENOL) oral liquid 160 mg/5 mL, bisacodyl, docusate, guaiFENesin-codeine  Antibiotics  :   Anti-infectives (From admission, onward)   Start     Dose/Rate Route Frequency Ordered Stop   11/08/17 1015   Ampicillin-Sulbactam (UNASYN) 3 g in sodium chloride 0.9 % 100 mL IVPB  Status:  Discontinued     3 g 200 mL/hr over 30 Minutes Intravenous Every 6 hours 11/08/17 1016 11/09/17 0908   11/07/17 1200  Ampicillin-Sulbactam (UNASYN) 3 g in sodium chloride 0.9 % 100 mL IVPB     3 g 200 mL/hr over 30 Minutes Intravenous  Once 11/07/17 1153 11/07/17 2011   11/07/17 1130  cefTRIAXone (ROCEPHIN) 2 g in sodium chloride 0.9 % 100 mL IVPB  Status:  Discontinued     2 g 200 mL/hr over 30 Minutes Intravenous  Once 11/07/17 1125 11/07/17 1153          Objective:   Vitals:   11/10/17 1047 11/10/17 1302 11/10/17 2034 11/11/17 0507  BP: (!) 143/59 (!) 135/55 (!) 138/55 (!) 155/65  Pulse: 70 67 72 62  Resp: (!) 24 (!) 28  18  Temp: 98.6 F (37 C) 99.3 F (37.4 C) 98.4 F (36.9 C) 97.8 F (36.6 C)  TempSrc: Oral Oral Oral Oral  SpO2: 93% 93% 91% 92%  Weight: 82.9 kg   82.6 kg  Height:        Wt Readings from Last 3 Encounters:  11/11/17 82.6 kg     Intake/Output Summary (Last 24 hours) at 11/11/2017 1058 Last data filed at 11/10/2017 1300 Gross per 24 hour  Intake 520 ml  Output -  Net 520 ml     Physical Exam  Awake Alert, Oriented X 3, No new F.N deficits, Normal affect, ++ cough [place].AT,PERRAL Supple Neck,No JVD, No cervical lymphadenopathy appriciated.  Symmetrical Chest wall movement, Good air movement bilaterally, +ve rales RRR,No Gallops,Rubs or new Murmurs, No Parasternal Heave +ve B.Sounds, Abd Soft, No tenderness, No  organomegaly appriciated, No rebound - guarding or rigidity. No Cyanosis, Clubbing or edema, No new Rash or bruise       Data Review:    CBC Recent Labs  Lab 11/07/17 0947 11/08/17 0348 11/09/17 0500 11/10/17 0303 11/10/17 0845 11/11/17 0450  WBC 13.7* 12.5* 11.9* 12.3* DUPLICATE  REFER X10552 11.9*  HGB 15.3 11.0* 10.7* 10.3* DUPLICATE  REFER X10552 10.8*  HCT 44.2 31.7* 32.7* 31.4* DUPLICATE  REFER X10552 32.4*  PLT 436* 334 339 350 DUPLICATE   REFER X10552 381  MCV 88.9 88.5 92.9 92.1 DUPLICATE  REFER X10552 92.0  MCH 30.8 30.7 30.4 30.2 DUPLICATE  REFER X10552 30.7  MCHC 34.6 34.7 32.7 32.8 DUPLICATE  REFER X10552 33.3  RDW 12.9 12.6 13.2 13.0 DUPLICATE  REFER X10552 12.9  LYMPHSABS 0.8  --   --  1.7 PENDING  --   MONOABS 1.2*  --   --  1.4* PENDING  --   EOSABS 0.1  --   --  0.4 PENDING  --   BASOSABS 0.0  --   --  0.1 PENDING  --     Chemistries  Recent Labs  Lab 11/07/17 0947 11/07/17 1353 11/07/17 1744 11/08/17 0753 11/08/17 1019 11/08/17 1618 11/09/17 0500 11/10/17 0303 11/11/17 0450  NA 136 133* 135 136  --   --   --  138 137  K 3.9 3.9 3.6 3.0*  --   --   --  3.4* 3.3*  CL 95* 99 103 107  --   --   --  107 104  CO2 15* 11* 18* 19*  --   --   --  21* 25  GLUCOSE 302* 298* 203* 181*  --   --   --  111* 45*  BUN 46* 42* 43* 43*  --   --   --  28* 17  CREATININE 1.41* 1.59* 1.29* 1.17  --   --   --  0.85 0.73  CALCIUM 8.8* 8.2* 7.9* 7.9*  --   --   --  7.5* 7.8*  MG  --   --   --   --  2.6* 2.4 2.5*  --   --   AST 20  --   --  21  --   --   --   --   --   ALT 16  --   --  14  --   --   --   --   --   ALKPHOS 82  --   --  59  --   --   --   --   --   BILITOT 1.3*  --   --  0.7  --   --   --   --   --    ------------------------------------------------------------------------------------------------------------------ No results for input(s): CHOL, HDL, LDLCALC, TRIG, CHOLHDL, LDLDIRECT in the last 72 hours.  Lab Results  Component Value Date   HGBA1C 7.9 (H) 11/08/2017   ------------------------------------------------------------------------------------------------------------------ No results for input(s): TSH, T4TOTAL, T3FREE, THYROIDAB in the last 72 hours.  Invalid input(s): FREET3 ------------------------------------------------------------------------------------------------------------------ No results for input(s): VITAMINB12, FOLATE, FERRITIN, TIBC, IRON, RETICCTPCT in the last 72  hours.  Coagulation profile No results for input(s): INR, PROTIME in the last 168 hours.  No results for input(s): DDIMER in the last 72 hours.  Cardiac Enzymes Recent Labs  Lab 11/07/17 1744 11/08/17 0004 11/08/17 0753  TROPONINI 1.77* 2.00* 1.42*   ------------------------------------------------------------------------------------------------------------------    Component Value Date/Time   BNP  557.8 (H) 11/11/2017 0450    Micro Results Recent Results (from the past 240 hour(s))  MRSA PCR Screening     Status: None   Collection Time: 11/07/17  1:41 PM  Result Value Ref Range Status   MRSA by PCR NEGATIVE NEGATIVE Final    Comment:        The GeneXpert MRSA Assay (FDA approved for NASAL specimens only), is one component of a comprehensive MRSA colonization surveillance program. It is not intended to diagnose MRSA infection nor to guide or monitor treatment for MRSA infections. Performed at [facility], [address], [place], [place] [address]     Radiology Reports Dg Chest [place] 1 View  Result Date: 11/11/2017 CLINICAL DATA:  62 year old male with shortness of breath and nonproductive cough. DKA, N-STEMI. EXAM: PORTABLE CHEST 1 VIEW COMPARISON:  11/09/2017 and earlier. FINDINGS: Portable AP upright view at 0930 hours. Extubated and enteric tube removed. Left subclavian central line removed. Continued right greater than left veiling lung opacity compatible with pleural effusion which is probably at least partially loculated on the right. Lower lung volumes since 11/07/2017. Stable mediastinal contour. No consolidation or pneumothorax. Negative visible bowel gas pattern. Prior sternotomy. IMPRESSION: 1. Extubated, left subclavian line, and enteric tube removed. 2. Lower lung volumes with persistent bilateral pleural effusions, suspected to be partially loculated on the right. Upright PA and lateral views would be helpful when possible. Electronically Signed   By: Odessa Fleming M.D.   On: 11/11/2017 09:40   Dg Chest Port 1 View  Result Date: 11/09/2017 CLINICAL DATA:  Respiratory failure. EXAM: PORTABLE CHEST 1 VIEW COMPARISON:  Chest x-rays dated 11/08/2017, 11/07/2017 and 02/07/2015 FINDINGS: Endotracheal tube tip is approximately 3.5 cm above the carina. Central line tip is at the cavoatrial junction. NG tube tip is below the diaphragm. Large loculated right pleural effusion persists, slightly increased. Pulmonary vascularity is now normal with no residual pulmonary edema. Slight atelectasis at the left base has almost completely resolved. Chronic lateral pleural thickening on the left. CABG.  No acute bone abnormality. IMPRESSION: 1. Resolution of pulmonary vascular congestion. 2. Slightly increased large loculated right pleural effusion. 3. Improved minimal atelectasis at the left lung base. Electronically Signed   By: Francene Boyers M.D.   On: 11/09/2017 07:31   Dg Chest Port 1 View  Result Date: 11/08/2017 CLINICAL DATA:  Respiratory failure EXAM: PORTABLE CHEST 1 VIEW COMPARISON:  11/07/2017 FINDINGS: Cardiac shadow is enlarged but stable. Left subclavian central line is noted in satisfactory position. Endotracheal tube and nasogastric catheter are again seen in satisfactory position. Right-sided pleural effusion is again noted but appears slightly improved although this may be positional in nature. Small left effusion is noted as well. No focal confluent infiltrate is seen. IMPRESSION: Bilateral pleural effusions right greater than left. No focal infiltrate is seen. Tubes and lines as described. Electronically Signed   By: Alcide Clever M.D.   On: 11/08/2017 08:40   Dg Chest Port 1 View  Result Date: 11/07/2017 CLINICAL DATA:  Intubated EXAM: PORTABLE CHEST 1 VIEW COMPARISON:  None. FINDINGS: Endotracheal tube tip is 2.6 cm above the carina. Enteric tube terminates in the gastric fundus. Left subclavian central venous catheter terminates at the cavoatrial  junction. Intact sternotomy wires. Low lung volumes. Top-normal heart size. Otherwise normal mediastinal contour. No pneumothorax. Bilateral pleural thickening/effusions, most prominent in the upper right pleural space and along the right fissures. Hazy parahilar and bibasilar opacities in both lungs. IMPRESSION: 1. Well-positioned  support structures.  No pneumothorax. 2. Bilateral pleural thickening/effusions, most prominent in the upper right pleural space and along the right fissures. 3. Hazy parahilar and bibasilar opacities in both lungs, which could represent atelectasis, aspiration or pneumonia. Electronically Signed   By: Delbert Phenix M.D.   On: 11/07/2017 16:48   Dg Chest Portable 1 View  Result Date: 11/07/2017 CLINICAL DATA:  62 year old male with shortness of breath for 5 days. Nausea vomiting. EXAM: PORTABLE CHEST 1 VIEW COMPARISON:  [facility] portable chest x-ray 02/07/2015 and earlier. FINDINGS: Portable AP semi upright view at 1050 hours. Stable sequelae of median sternotomy. Cardiac and mediastinal contours are within normal limits. Visualized tracheal air column is within normal limits. Mixed veiling and confluent opacity in the right lung appears at least in part due to loculated pleural effusion. Streaky opacity at the left lung base more resembling atelectasis. No superimposed pneumothorax. Pulmonary vascularity appears within normal limits. Negative visible bowel gas pattern. No acute osseous abnormality identified. IMPRESSION: 1. Abnormal right lung opacity, new since 2016, appears at least in part related to loculated pleural effusion. 2. Left lung base atelectasis or scarring suspected. Electronically Signed   By: Odessa Fleming M.D.   On: 11/07/2017 11:20    Time Spent in minutes  30   Susa Raring M.D on 11/11/2017 at 10:58 AM  To page go to [email or web address] - password [facility]

## 2017-11-11 NOTE — Progress Notes (Addendum)
Progress Note  Patient Name: Brian Miller Date of Encounter: 11/11/2017  Primary Cardiologist: [place] ([facility])  Subjective   Pt denies chest pain or palpitations. Continues to have dry, non-productive cough.   Inpatient Medications    Scheduled Meds: . dextrose      . aspirin  81 mg Per Tube Daily  . clopidogrel  75 mg Oral Daily  . Influenza vac split quadrivalent PF  0.5 mL Intramuscular Tomorrow-1000  . insulin aspart  0-15 Units Subcutaneous TID WC  . insulin detemir  30 Units Subcutaneous Q12H  . metoprolol succinate  25 mg Oral Daily  . rosuvastatin  20 mg Oral q1800   Continuous Infusions: . potassium chloride     PRN Meds: acetaminophen (TYLENOL) oral liquid 160 mg/5 mL, bisacodyl, docusate, guaiFENesin   Vital Signs    Vitals:   11/10/17 1047 11/10/17 1302 11/10/17 2034 11/11/17 0507  BP: (!) 143/59 (!) 135/55 (!) 138/55 (!) 155/65  Pulse: 70 67 72 62  Resp: (!) 24 (!) 28  18  Temp: 98.6 F (37 C) 99.3 F (37.4 C) 98.4 F (36.9 C) 97.8 F (36.6 C)  TempSrc: Oral Oral Oral Oral  SpO2: 93% 93% 91% 92%  Weight: 82.9 kg   82.6 kg  Height:        Intake/Output Summary (Last 24 hours) at 11/11/2017 0847 Last data filed at 11/10/2017 1300 Gross per 24 hour  Intake 520 ml  Output -  Net 520 ml   Filed Weights   11/10/17 0500 11/10/17 1047 11/11/17 0507  Weight: 82.9 kg 82.9 kg 82.6 kg    Physical Exam   General: Well developed, well nourished, NAD Skin: Warm, dry, intact  Head: Normocephalic, atraumatic,clear, moist mucus membranes. Neck: Negative for carotid bruits. No JVD Lungs:Clear to ausculation bilaterally. No wheezes, rales, or rhonchi. Breathing is unlabored. Cardiovascular: RRR with S1 S2. No murmurs, rubs, gallops, or LV heave appreciated. Abdomen: Soft, non-tender, non-distended with normoactive bowel sounds. No obvious abdominal masses. MSK: Strength and tone appear normal for age. 5/5 in all  extremities Extremities: No edema. No clubbing or cyanosis. DP/PT pulses 2+ bilaterally Neuro: Alert and oriented. No focal deficits. No facial asymmetry. MAE spontaneously. Psych: Responds to questions appropriately with normal affect.    Labs    Chemistry Recent Labs  Lab 11/07/17 [phone]  11/08/17 0753 11/10/17 0303 11/11/17 0450  NA 136   < > 136 138 137  K 3.9   < > 3.0* 3.4* 3.3*  CL 95*   < > 107 107 104  CO2 15*   < > 19* 21* 25  GLUCOSE 302*   < > 181* 111* 45*  BUN 46*   < > 43* 28* 17  CREATININE 1.41*   < > 1.17 0.85 0.73  CALCIUM 8.8*   < > 7.9* 7.5* 7.8*  PROT 7.9  --  5.6*  --   --   ALBUMIN 3.3*  --  2.2*  --   --   AST 20  --  21  --   --   ALT 16  --  14  --   --   ALKPHOS 82  --  59  --   --   BILITOT 1.3*  --  0.7  --   --   GFRNONAA 52*   < > >60 >60 >60  GFRAA >60   < > >60 >60 >60  ANIONGAP 26*   < > 10 10 8    < > =  values in this interval not displayed.     Hematology Recent Labs  Lab 11/10/17 0303 11/10/17 0845 11/11/17 0450  WBC 12.3* DUPLICATE  REFER X10552 11.9*  RBC 3.41* DUPLICATE  REFER X10552 3.52*  HGB 10.3* DUPLICATE  REFER X10552 10.8*  HCT 31.4* DUPLICATE  REFER X10552 32.4*  MCV 92.1 DUPLICATE  REFER X10552 92.0  MCH 30.2 DUPLICATE  REFER X10552 30.7  MCHC 32.8 DUPLICATE  REFER X10552 33.3  RDW 13.0 DUPLICATE  REFER X10552 12.9  PLT 350 DUPLICATE  REFER X10552 381   Cardiac Enzymes Recent Labs  Lab 11/07/17 1353 11/07/17 1744 11/08/17 0004 11/08/17 0753  TROPONINI 0.59* 1.77* 2.00* 1.42*   No results for input(s): TROPIPOC in the last 168 hours.   BNP Recent Labs  Lab 11/07/17 0947  BNP 129.3*     DDimer No results for input(s): DDIMER in the last 168 hours.   Radiology    No results found.  Telemetry    11/11/17 NSR - Personally Reviewed  ECG    No new tracing as of 11/11/17 - Personally Reviewed  Cardiac Studies   Echocardiogram 11/07/2017:  - Left ventricle: The cavity size was normal. Wall  thickness was increased in a pattern of mild LVH. Systolic function was normal. The estimated ejection fraction was in the range of 55% to 60%. - Aortic valve: There was mild regurgitation. - Mitral valve: Calcified annulus. There was mild regurgitation.  Patient Profile     62 y.o. male with a hx of CAD s/p CABG (2002) and PCI (2015) who is being seen today for the evaluation of respiratory distress at the request of Dr. Molli Knock.  Assessment & Plan    1. NSTEMI: -Pt presented to [facility] with N/V and two days of cough a dyspnea found to have DKA>>>while receiving IV fluid hydration pt went into acute respiratory failure requiring ETT ventilation. Lactic acid noted to be elevated on presentation at 4.4 -Found to have elevated troponin level, peak 2.00 (0.59>1.77>2.00>1.42) thought to be in the setting of demand ischemia given acute illness and respiratory failure -Denies chest pain or associated symptoms today -Plans are for possible cardiac cath once more stable from a respiratory status given prior hx of CABG -Creatinine stabilizing, see below -Pt has been NPO   -Continue ASA, Toprol XL, statin, Plavix 75   2. CAD with CABG (2002) and previous stenting to SVG to LCX (2015): -Currently chest pain free  -Continue ASA, Plavix 75, Toprol XL 25mg , Crestor 20mg    3. DKA with acute respiratory failure: -Likely in the setting of acute N/V/D -ETT ventilation>>>now extubated and doing well  -SSI for glucose control while inpatient status  -Per PCCM/primary team    4. Acute Kidney injury: -Creatinine, 0.73 today>>improved from 0.85 yesterday  -Peak creatinine 1.59 -Continue to avoid nephrotoxic medications   5. Hypokalemia: -K+, 3.3 today  -Replaced with IV K+ per primary team  -Follow with daily BMET   Signed, Georgie Chard NP-C [facility] Pager: [phone] 11/11/2017, 8:47 AM     For questions or updates, please contact   Please consult [email or web address]  for contact info under Cardiology/STEMI.  Personally seen and examined. Agree with above.  62 year old with coronary disease status post CABG back in 2002 and PCI to SVG to left circumflex in 2015 here with acute respiratory failure, DKA, acute kidney injury resolved with non-ST elevation myocardial infarction, peak troponin of 2 likely type II MI, demand ischemia in the setting of his underlying illness.  Overall still coughing but denies any significant chest pain nausea  GEN: Well nourished, well developed, in no acute distress  HEENT: normal  Neck: no JVD, carotid bruits, or masses Cardiac: RRR; no murmurs, rubs, or gallops,no edema  Respiratory:  clear to auscultation bilaterally, normal work of breathing GI: soft, nontender, nondistended, + BS MS: no deformity or atrophy  Skin: warm and dry, no rash Neuro:  Alert and Oriented x 3, Strength and sensation are intact Psych: euthymic mood, full affect  Echo 11/07/2017-EF 60%, mild aortic regurgitation, mild mitral regurgitation  Telemetry unremarkable personally reviewed and interpreted.  Normal sinus rhythm.  EKG normal sinus rhythm mild ST segment depression anterior and lateral precordial leads.  Previous EKG showed marked ST segment depression anterior lateral leads.  Ischemic.  Personally reviewed and interpreted.  Assessment and plan:  Non-ST elevation myocardial infarction-likely type II MI in the setting of underlying medical illness -Peak troponin 2, marked ST segment depression on ECG 11/07/2017. - Agree that cardiac catheterization would likely be helpful prior to discharge.  However, he was still struggling with cough pretty significantly in the room and I would like to see this under better control prior to cardiac cath.  We will go ahead and tentatively set him up for tomorrow.  Make him n.p.o. past midnight.  Previously they went through left radial site.  CAD CABG 2002 SVG to left circumflex stent 2015 - Marked ST  segment depression 11/07/2017 in the setting of respiratory failure and DKA.  Continuing with aggressive secondary prevention aspirin Plavix Toprol Crestor currently chest pain-free.  DKA -Per critical care team extubated, hypoglycemia noted this morning.  Acute kidney injury - The setting of acute DKA, 1.59 creatinine down to 0.73.  Excellent improvement.  Hypokalemia - Repleting.  Donato Schultz, MD

## 2017-11-12 ENCOUNTER — Encounter (HOSPITAL_COMMUNITY)
Admission: EM | Disposition: A | Discharge status: Home / Self Care | Payer: Self-pay | Source: Home / Self Care | Attending: Pulmonary Disease

## 2017-11-12 ENCOUNTER — Encounter (HOSPITAL_COMMUNITY): Payer: Self-pay | Admitting: General Practice

## 2017-11-12 DIAGNOSIS — E0810 Diabetes mellitus due to underlying condition with ketoacidosis without coma: Secondary | ICD-10-CM

## 2017-11-12 HISTORY — PX: CARDIAC CATHETERIZATION: SHX172

## 2017-11-12 HISTORY — PX: LEFT HEART CATH AND CORS/GRAFTS ANGIOGRAPHY: CATH118250

## 2017-11-12 LAB — BASIC METABOLIC PANEL
Anion gap: 11 (ref 5–15)
BUN: 16 mg/dL (ref 8–23)
CALCIUM: 7.7 mg/dL — AB (ref 8.9–10.3)
CO2: 25 mmol/L (ref 22–32)
Chloride: 95 mmol/L — ABNORMAL LOW (ref 98–111)
Creatinine, Ser: 0.85 mg/dL (ref 0.61–1.24)
GFR calc Af Amer: >60 mL/min (ref >60)
GLUCOSE: 240 mg/dL — AB (ref 70–99)
Potassium: 4.1 mmol/L (ref 3.5–5.1)
SODIUM: 131 mmol/L — AB (ref 135–145)

## 2017-11-12 LAB — CBC
HCT: 34.3 % — ABNORMAL LOW (ref 39.0–52.0)
Hemoglobin: 11.2 g/dL — ABNORMAL LOW (ref 13.0–17.0)
MCH: 29.8 pg (ref 26.0–34.0)
MCHC: 32.7 g/dL (ref 30.0–36.0)
MCV: 91.2 fL (ref 78.0–100.0)
PLATELETS: 403 10*3/uL — AB (ref 150–400)
RBC: 3.76 MIL/uL — AB (ref 4.22–5.81)
RDW: 12.6 % (ref 11.5–15.5)
WBC: 10.7 10*3/uL — ABNORMAL HIGH (ref 4.0–10.5)

## 2017-11-12 LAB — GLUCOSE, CAPILLARY
Glucose-Capillary: 244 mg/dL — ABNORMAL HIGH (ref 70–99)
Glucose-Capillary: 194 mg/dL — ABNORMAL HIGH (ref 70–99)
Glucose-Capillary: 306 mg/dL — ABNORMAL HIGH (ref 70–99)

## 2017-11-12 LAB — MAGNESIUM: MAGNESIUM: 1.6 mg/dL — AB (ref 1.7–2.4)

## 2017-11-12 SURGERY — LEFT HEART CATH AND CORS/GRAFTS ANGIOGRAPHY
Anesthesia: LOCAL

## 2017-11-12 MED ORDER — SODIUM CHLORIDE 0.9 % IV SOLN: INTRAVENOUS | Status: DC

## 2017-11-12 MED ORDER — SODIUM CHLORIDE 0.9% FLUSH: 3.0000 mL | INTRAVENOUS | Status: DC | PRN

## 2017-11-12 MED ORDER — INSULIN ASPART 100 UNIT/ML ~~LOC~~ SOLN
0.0000 [IU] | Freq: Three times a day (TID) | SUBCUTANEOUS | Status: DC
  Administered 2017-11-13: 3 [IU] via SUBCUTANEOUS
  Administered 2017-11-13: 17:00:00 5 [IU] via SUBCUTANEOUS
  Administered 2017-11-13 – 2017-11-14 (×2): 8 [IU] via SUBCUTANEOUS
  Administered 2017-11-14: 3 [IU] via SUBCUTANEOUS
  Administered 2017-11-15: 15 [IU] via SUBCUTANEOUS
  Administered 2017-11-15 (×2): 3 [IU] via SUBCUTANEOUS

## 2017-11-12 MED ORDER — FENTANYL CITRATE (PF) 100 MCG/2ML IJ SOLN
INTRAMUSCULAR | Status: DC | PRN
  Administered 2017-11-12: 25 ug via INTRAVENOUS

## 2017-11-12 MED ORDER — HEPARIN SODIUM (PORCINE) 1000 UNIT/ML IJ SOLN
INTRAMUSCULAR | Status: AC
  Filled 2017-11-12: qty 1

## 2017-11-12 MED ORDER — SODIUM CHLORIDE 0.9 % IV SOLN: 250.0000 mL | INTRAVENOUS | Status: DC | PRN

## 2017-11-12 MED ORDER — LIDOCAINE HCL (PF) 1 % IJ SOLN
INTRAMUSCULAR | Status: DC | PRN
  Administered 2017-11-12: 2 mL

## 2017-11-12 MED ORDER — INSULIN ASPART 100 UNIT/ML ~~LOC~~ SOLN
0.0000 [IU] | SUBCUTANEOUS | Status: DC
  Administered 2017-11-12: 3 [IU] via SUBCUTANEOUS
  Administered 2017-11-12: 5 [IU] via SUBCUTANEOUS

## 2017-11-12 MED ORDER — HEPARIN (PORCINE) IN NACL 1000-0.9 UT/500ML-% IV SOLN
INTRAVENOUS | Status: AC
  Filled 2017-11-12: qty 1000

## 2017-11-12 MED ORDER — VERAPAMIL HCL 2.5 MG/ML IV SOLN
INTRAVENOUS | Status: DC | PRN
  Administered 2017-11-12: 10 mL via INTRA_ARTERIAL

## 2017-11-12 MED ORDER — HEPARIN (PORCINE) IN NACL 1000-0.9 UT/500ML-% IV SOLN
INTRAVENOUS | Status: DC | PRN
  Administered 2017-11-12 (×2): 500 mL

## 2017-11-12 MED ORDER — HEPARIN SODIUM (PORCINE) 1000 UNIT/ML IJ SOLN
INTRAMUSCULAR | Status: DC | PRN
  Administered 2017-11-12: 4000 [IU] via INTRAVENOUS

## 2017-11-12 MED ORDER — INSULIN ASPART 100 UNIT/ML ~~LOC~~ SOLN
0.0000 [IU] | Freq: Every day | SUBCUTANEOUS | Status: DC
  Administered 2017-11-12: 4 [IU] via SUBCUTANEOUS
  Administered 2017-11-13 – 2017-11-15 (×3): 2 [IU] via SUBCUTANEOUS

## 2017-11-12 MED ORDER — ENOXAPARIN SODIUM 40 MG/0.4ML ~~LOC~~ SOLN
40.0000 mg | SUBCUTANEOUS | Status: DC
  Administered 2017-11-13 – 2017-11-14 (×2): 40 mg via SUBCUTANEOUS
  Filled 2017-11-12 (×2): qty 0.4

## 2017-11-12 MED ORDER — LIDOCAINE HCL (PF) 1 % IJ SOLN
INTRAMUSCULAR | Status: AC
  Filled 2017-11-12: qty 30

## 2017-11-12 MED ORDER — MIDAZOLAM HCL 2 MG/2ML IJ SOLN
INTRAMUSCULAR | Status: DC | PRN
  Administered 2017-11-12: 1 mg via INTRAVENOUS

## 2017-11-12 MED ORDER — SODIUM CHLORIDE 0.9 % IV SOLN
INTRAVENOUS | Status: AC | PRN
  Administered 2017-11-12: 81 mL/h via INTRAVENOUS

## 2017-11-12 MED ORDER — SODIUM CHLORIDE 0.9% FLUSH
3.0000 mL | INTRAVENOUS | Status: DC | PRN
  Administered 2017-11-15: 3 mL via INTRAVENOUS
  Filled 2017-11-12: qty 3

## 2017-11-12 MED ORDER — SODIUM CHLORIDE 0.9% FLUSH: 3.0000 mL | Freq: Two times a day (BID) | INTRAVENOUS | Status: DC

## 2017-11-12 MED ORDER — MIDAZOLAM HCL 2 MG/2ML IJ SOLN
INTRAMUSCULAR | Status: AC
  Filled 2017-11-12: qty 2

## 2017-11-12 MED ORDER — SODIUM CHLORIDE 0.9 % WEIGHT BASED INFUSION: 1.0000 mL/kg/h | INTRAVENOUS | Status: AC

## 2017-11-12 MED ORDER — IOHEXOL 350 MG/ML SOLN
INTRAVENOUS | Status: DC | PRN
  Administered 2017-11-12: 70 mL via INTRACARDIAC

## 2017-11-12 MED ORDER — FENTANYL CITRATE (PF) 100 MCG/2ML IJ SOLN
INTRAMUSCULAR | Status: AC
  Filled 2017-11-12: qty 2

## 2017-11-12 MED ORDER — MAGNESIUM SULFATE 2 GM/50ML IV SOLN
2.0000 g | Freq: Once | INTRAVENOUS | Status: AC
  Administered 2017-11-12: 2 g via INTRAVENOUS
  Filled 2017-11-12: qty 50

## 2017-11-12 MED ORDER — SODIUM CHLORIDE 0.9% FLUSH
3.0000 mL | Freq: Two times a day (BID) | INTRAVENOUS | Status: DC
  Administered 2017-11-13 – 2017-11-16 (×7): 3 mL via INTRAVENOUS

## 2017-11-12 MED ORDER — VERAPAMIL HCL 2.5 MG/ML IV SOLN
INTRAVENOUS | Status: AC
  Filled 2017-11-12: qty 2

## 2017-11-12 SURGICAL SUPPLY — 10 items
CATH INFINITI 5FR MULTPACK ANG (CATHETERS) ×2 IMPLANT
DEVICE RAD COMP TR BAND LRG (VASCULAR PRODUCTS) ×2 IMPLANT
GLIDESHEATH SLEND SS 6F .021 (SHEATH) ×2 IMPLANT
GUIDEWIRE INQWIRE 1.5J.035X260 (WIRE) ×1 IMPLANT
INQWIRE 1.5J .035X260CM (WIRE) ×2
KIT HEART LEFT (KITS) ×2 IMPLANT
PACK CARDIAC CATHETERIZATION (CUSTOM PROCEDURE TRAY) ×2 IMPLANT
SYR MEDRAD MARK V 150ML (SYRINGE) ×2 IMPLANT
TRANSDUCER W/STOPCOCK (MISCELLANEOUS) ×2 IMPLANT
TUBING CIL FLEX 10 FLL-RA (TUBING) ×2 IMPLANT

## 2017-11-12 NOTE — H&P (View-Only) (Signed)
 Progress Note  Patient Name: Brian Miller Date of Encounter: 11/12/2017  Primary Cardiologist: No primary care provider on file.  Primary cardiologist is at [facility]   Subjective   Cough is markedly improved.  No fevers chills nausea vomiting  Inpatient Medications    Scheduled Meds: . aspirin  81 mg Per Tube Daily  . benzonatate  200 mg Oral TID  . clopidogrel  75 mg Oral Daily  . feeding supplement (GLUCERNA SHAKE)  237 mL Oral TID BM  . heparin injection (subcutaneous)  5,000 Units Subcutaneous Q8H  . Influenza vac split quadrivalent PF  0.5 mL Intramuscular Tomorrow-1000  . insulin aspart  0-15 Units Subcutaneous Q4H  . insulin detemir  30 Units Subcutaneous Q12H  . metoprolol succinate  25 mg Oral Daily  . rosuvastatin  20 mg Oral q1800   Continuous Infusions:  PRN Meds: acetaminophen (TYLENOL) oral liquid 160 mg/5 mL, bisacodyl, docusate, guaiFENesin-codeine   Vital Signs    Vitals:   11/11/17 1349 11/11/17 2157 11/12/17 0300 11/12/17 1228  BP: 138/66 (!) 133/52 (!) 136/54 (!) 122/54  Pulse: 67 66 (!) 53 62  Resp: 18 18 18 18  Temp: 97.9 F (36.6 C) 97.6 F (36.4 C) 99.6 F (37.6 C) 98.7 F (37.1 C)  TempSrc: Oral Oral Oral   SpO2: 93% 94%  98%  Weight:   81.2 kg   Height:        Intake/Output Summary (Last 24 hours) at 11/12/2017 1238 Last data filed at 11/12/2017 1153 Gross per 24 hour  Intake 322 ml  Output 2810 ml  Net -2488 ml   Filed Weights   11/10/17 1047 11/11/17 0507 11/12/17 0300  Weight: 82.9 kg 82.6 kg 81.2 kg    Telemetry    Sinus bradycardia with occasional PACs- Personally Reviewed  ECG    No new- Personally Reviewed  Physical Exam   GEN: No acute distress.   Neck: No JVD Cardiac: RRR, no murmurs, rubs, or gallops.  Respiratory: Clear to auscultation bilaterally.  Minimal cough at times with deep respiration GI: Soft, nontender, non-distended  MS: No edema; No deformity. Neuro:   Nonfocal  Psych: Normal affect   Labs    Chemistry Recent Labs  Lab 11/07/17 0947  11/08/17 0753 11/10/17 0303 11/11/17 0450 11/12/17 0459  NA 136   < > 136 138 137 131*  K 3.9   < > 3.0* 3.4* 3.3* 4.1  CL 95*   < > 107 107 104 95*  CO2 15*   < > 19* 21* 25 25  GLUCOSE 302*   < > 181* 111* 45* 240*  BUN 46*   < > 43* 28* 17 16  CREATININE 1.41*   < > 1.17 0.85 0.73 0.85  CALCIUM 8.8*   < > 7.9* 7.5* 7.8* 7.7*  PROT 7.9  --  5.6*  --   --   --   ALBUMIN 3.3*  --  2.2*  --   --   --   AST 20  --  21  --   --   --   ALT 16  --  14  --   --   --   ALKPHOS 82  --  59  --   --   --   BILITOT 1.3*  --  0.7  --   --   --   GFRNONAA 52*   < > >60 >60 >60 >60  GFRAA >60   < > >60 >  60 >60 >60  ANIONGAP 26*   < > 10 10 8 11   < > = values in this interval not displayed.     Hematology Recent Labs  Lab 11/10/17 0845 11/11/17 0450 11/12/17 0459  WBC DUPLICATE  REFER X10552 11.9* 10.7*  RBC DUPLICATE  REFER X10552 3.52* 3.76*  HGB DUPLICATE  REFER X10552 10.8* 11.2*  HCT DUPLICATE  REFER X10552 32.4* 34.3*  MCV DUPLICATE  REFER X10552 92.0 91.2  MCH DUPLICATE  REFER X10552 30.7 29.8  MCHC DUPLICATE  REFER X10552 33.3 32.7  RDW DUPLICATE  REFER X10552 12.9 12.6  PLT DUPLICATE  REFER X10552 381 403*    Cardiac Enzymes Recent Labs  Lab 11/07/17 1353 11/07/17 1744 11/08/17 0004 11/08/17 0753  TROPONINI 0.59* 1.77* 2.00* 1.42*   No results for input(s): TROPIPOC in the last 168 hours.   BNP Recent Labs  Lab 11/07/17 0947 11/11/17 0450  BNP 129.3* 557.8*     DDimer No results for input(s): DDIMER in the last 168 hours.   Radiology    Dg Chest 2 View  Result Date: 11/11/2017 CLINICAL DATA:  Severe dyspnea.  Prior cardiac surgery. EXAM: CHEST - 2 VIEW COMPARISON:  11/11/2017 CXR, chest CT 05/26/2013 FINDINGS: Interval increase in loculated right-sided pleural effusion greatest along the posterior aspect of the right hemithorax as suggested on the lateral view and  along the right upper thorax laterally. Small left effusion. Cardiomegaly stable. Nonaneurysmal appearing thoracic aorta. The patient is status post median sternotomy and CABG. Calcified pleural plaque at the left lung base. There is some bibasilar streaky atelectatic change. Degenerative changes are present about the thoracic spine. IMPRESSION: 1. Moderate to large loculated pleural effusion along the posterior and right upper lateral aspect of the thorax. Fluid is also seen extending into the minor fissure. 2. 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 3. Stable cardiomegaly. Electronically Signed   By: David  Kwon M.D.   On: 11/11/2017 17:34   Dg Chest Port 1 View  Result Date: 11/11/2017 CLINICAL DATA:  62-year-old male with shortness of breath and nonproductive cough. DKA, N-STEMI. EXAM: PORTABLE CHEST 1 VIEW COMPARISON:  11/09/2017 and earlier. FINDINGS: Portable AP upright view at 0930 hours. Extubated and enteric tube removed. Left subclavian central line removed. Continued right greater than left veiling lung opacity compatible with pleural effusion which is probably at least partially loculated on the right. Lower lung volumes since 11/07/2017. Stable mediastinal contour. No consolidation or pneumothorax. Negative visible bowel gas pattern. Prior sternotomy. IMPRESSION: 1. Extubated, left subclavian line, and enteric tube removed. 2. Lower lung volumes with persistent bilateral pleural effusions, suspected to be partially loculated on the right. Upright PA and lateral views would be helpful when possible. Electronically Signed   By: H  Hall M.D.   On: 11/11/2017 09:40    Cardiac Studies   Cardiac catheterization 2015- occluded SVG to OM, patent LIMA to LAD, had PCI to left circumflex  Patient Profile     62 y.o. male here with non-ST elevation myocardial infarction, possibly type II MI in setting of DKA with known CAD post CABG LIMA to LAD, SVG to  OM is occluded status post stent to the left circumflex in October 2015.  Assessment & Plan     Non-ST elevation myocardial infarction-likely type II MI in the setting of underlying medical illness -Peak troponin 2, marked ST segment depression on ECG 11/07/2017. -His cough is markedly improved.  We will go ahead and place him   on add on board for cardiac catheterization today.  He is n.p.o.  Last heart cath, left radial.  Only 2 grafts were reported from prior cardiology note reviewed from care everywhere, LIMA to LAD and SVG to obtuse marginal.  Need to shoot LIMA to LAD graft only.  His SVG to obtuse marginal is occluded. -Prior circumflex stent. - Marked ST segment depression was noted on ECG 11/07/2017.  CAD post CABG 2002 LIMA to LAD, SVG to circumflex - SVG to OM is occluded.  Native circumflex was stented in 2015.  DKA - Resolved.  Post intubation.  Acute kidney injury -Resolved.  Creatinine back to baseline.      For questions or updates, please contact [facility] Please consult [email or web address] for contact info under        Signed, Mark Skains, MD  11/12/2017, 12:38 PM    

## 2017-11-12 NOTE — Progress Notes (Signed)
@IPLOG @        PROGRESS NOTE                                                                                                                                                                                                             Patient Demographics:    Brian Miller, is a 62 y.o. male, DOB - Feb 16, 1956, ZOX:[id number]  Admit date - 11/07/2017   Admitting Physician Alyson Reedy, MD  Outpatient Primary MD for the patient is Haimes, Teena Irani, MD  LOS - 5  Chief Complaint  Patient presents with  . Shortness of Breath       Brief Narrative -  62 year old Caucasian gentleman with history of DM2, HTN, dyslipidemia who was admitted to the hospital by pulmonary critical care due to shortness of breath, was found to have an STEMI with acute on chronic diastolic CHF, required intubation, post extubation he developed unrelenting cough, chest x-ray shows pleural effusions bilaterally with right-sided being loculated, he was also seen by cardiology initially stabilized extubated and was transferred to my care on 11/11/2017 under hospitalist service.   Subjective:   Patient in bed, appears comfortable, denies any headache, no fever, no chest pain or pressure, much improved cough and shortness of breath , no abdominal pain. No focal weakness.    Assessment  & Plan :     1.  Acute hypoxic respiratory failure requiring intubation caused by acute on chronic diastolic CHF, NSTEMI.  Cardiology on board, he was diuresed with IV Lasix on 11/10/2017, his shortness of breath and chronic persistent cough are much improved, hold further Lasix left heart cath is done creatinine is stable, continue beta-blocker along with dual antiplatelet therapy and statin for secondary prevention.  Chest pain-free.  Cardiology on board and contemplating left heart catheterization later today.  2.  Dyslipidemia.  On statin continue.  3.  Hypertension.  Currently on beta-blocker and diuretic, PRN hydralazine added will  monitor.   4.  ARF.  Resolved.   5.  Bilateral pleural effusions.  Likely due to CHF, right-sided is loculated 2 view chest x-ray reviewed, once left heart cath is done ultrasound-guided right-sided thoracentesis can be done for both diagnostic and therapeutic purposes.  6. DM type II.  Was in DKA on admission.  Currently on Lantus and sliding scale, dose reduced as he was hypoglycemic yesterday morning, and currently n.p.o. for left heart cath, monitor and adjust once oral diet is better.  Lab Results  Component Value Date   HGBA1C 7.9 (H) 11/08/2017  CBG (last 3)  Recent Labs    11/11/17 1645 11/11/17 2159 11/12/17 0744  GLUCAP 205* 264* 244*     Family Communication  :  None  Code Status :  Full  Disposition Plan  :  Tele  Consults  :  Cards, PCCM  Procedures  :    TTE  - Left ventricle: The cavity size was normal. Wall thickness was ncreased in a pattern of mild LVH. Systolic function was normal.   The estimated ejection fraction was in the range of 55% to 60%.  Aortic valve: There was mild regurgitation.  Mitral valve: Calcified annulus. There was mild regurgitation.  DVT Prophylaxis  :    Heparin   Lab Results  Component Value Date   PLT 403 (H) 11/12/2017    Diet :  Diet Order            Diet NPO time specified  Diet effective midnight               Inpatient Medications Scheduled Meds: . aspirin  81 mg Per Tube Daily  . benzonatate  200 mg Oral TID  . clopidogrel  75 mg Oral Daily  . feeding supplement (GLUCERNA SHAKE)  237 mL Oral TID BM  . heparin injection (subcutaneous)  5,000 Units Subcutaneous Q8H  . Influenza vac split quadrivalent PF  0.5 mL Intramuscular Tomorrow-1000  . insulin aspart  0-15 Units Subcutaneous Q4H  . insulin detemir  30 Units Subcutaneous Q12H  . metoprolol succinate  25 mg Oral Daily  . rosuvastatin  20 mg Oral q1800   Continuous Infusions:  PRN Meds:.acetaminophen (TYLENOL) oral liquid 160 mg/5 mL, bisacodyl,  docusate, guaiFENesin-codeine  Antibiotics  :   Anti-infectives (From admission, onward)   Start     Dose/Rate Route Frequency Ordered Stop   11/08/17 1015  Ampicillin-Sulbactam (UNASYN) 3 g in sodium chloride 0.9 % 100 mL IVPB  Status:  Discontinued     3 g 200 mL/hr over 30 Minutes Intravenous Every 6 hours 11/08/17 1016 11/09/17 0908   11/07/17 1200  Ampicillin-Sulbactam (UNASYN) 3 g in sodium chloride 0.9 % 100 mL IVPB     3 g 200 mL/hr over 30 Minutes Intravenous  Once 11/07/17 1153 11/07/17 2011   11/07/17 1130  cefTRIAXone (ROCEPHIN) 2 g in sodium chloride 0.9 % 100 mL IVPB  Status:  Discontinued     2 g 200 mL/hr over 30 Minutes Intravenous  Once 11/07/17 1125 11/07/17 1153          Objective:   Vitals:   11/11/17 0507 11/11/17 1349 11/11/17 2157 11/12/17 0300  BP: (!) 155/65 138/66 (!) 133/52 (!) 136/54  Pulse: 62 67 66 (!) 53  Resp: 18 18 18 18   Temp: 97.8 F (36.6 C) 97.9 F (36.6 C) 97.6 F (36.4 C) 99.6 F (37.6 C)  TempSrc: Oral Oral Oral Oral  SpO2: 92% 93% 94%   Weight: 82.6 kg   81.2 kg  Height:        Wt Readings from Last 3 Encounters:  11/12/17 81.2 kg     Intake/Output Summary (Last 24 hours) at 11/12/2017 1109 Last data filed at 11/12/2017 0845 Gross per 24 hour  Intake 644 ml  Output 2510 ml  Net -1866 ml     Physical Exam  Awake Alert, Oriented X 3, No new F.N deficits, Normal affect [place].AT,PERRAL Supple Neck,No JVD, No cervical lymphadenopathy appriciated.  Symmetrical Chest wall movement, Good air movement bilaterally, few rales RRR,No Gallops,  Rubs or new Murmurs, No Parasternal Heave +ve B.Sounds, Abd Soft, No tenderness, No organomegaly appriciated, No rebound - guarding or rigidity. No Cyanosis, Clubbing or edema, No new Rash or bruise        Data Review:    CBC Recent Labs  Lab 11/07/17 0947  11/09/17 0500 11/10/17 0303 11/10/17 0845 11/11/17 0450 11/12/17 0459  WBC 13.7*   < > 11.9* 12.3* DUPLICATE  REFER X10552  11.9* 10.7*  HGB 15.3   < > 10.7* 10.3* DUPLICATE  REFER X10552 10.8* 11.2*  HCT 44.2   < > 32.7* 31.4* DUPLICATE  REFER X10552 32.4* 34.3*  PLT 436*   < > 339 350 DUPLICATE  REFER X10552 381 403*  MCV 88.9   < > 92.9 92.1 DUPLICATE  REFER X10552 92.0 91.2  MCH 30.8   < > 30.4 30.2 DUPLICATE  REFER X10552 30.7 29.8  MCHC 34.6   < > 32.7 32.8 DUPLICATE  REFER X10552 33.3 32.7  RDW 12.9   < > 13.2 13.0 DUPLICATE  REFER X10552 12.9 12.6  LYMPHSABS 0.8  --   --  1.7 PENDING  --   --   MONOABS 1.2*  --   --  1.4* PENDING  --   --   EOSABS 0.1  --   --  0.4 PENDING  --   --   BASOSABS 0.0  --   --  0.1 PENDING  --   --    < > = values in this interval not displayed.    Chemistries  Recent Labs  Lab 11/07/17 0947  11/07/17 1744 11/08/17 0753 11/08/17 1019 11/08/17 1618 11/09/17 0500 11/10/17 0303 11/11/17 0450 11/12/17 0459  NA 136   < > 135 136  --   --   --  138 137 131*  K 3.9   < > 3.6 3.0*  --   --   --  3.4* 3.3* 4.1  CL 95*   < > 103 107  --   --   --  107 104 95*  CO2 15*   < > 18* 19*  --   --   --  21* 25 25  GLUCOSE 302*   < > 203* 181*  --   --   --  111* 45* 240*  BUN 46*   < > 43* 43*  --   --   --  28* 17 16  CREATININE 1.41*   < > 1.29* 1.17  --   --   --  0.85 0.73 0.85  CALCIUM 8.8*   < > 7.9* 7.9*  --   --   --  7.5* 7.8* 7.7*  MG  --   --   --   --  2.6* 2.4 2.5*  --   --  1.6*  AST 20  --   --  21  --   --   --   --   --   --   ALT 16  --   --  14  --   --   --   --   --   --   ALKPHOS 82  --   --  59  --   --   --   --   --   --   BILITOT 1.3*  --   --  0.7  --   --   --   --   --   --    < > = values  in this interval not displayed.   ------------------------------------------------------------------------------------------------------------------ No results for input(s): CHOL, HDL, LDLCALC, TRIG, CHOLHDL, LDLDIRECT in the last 72 hours.  Lab Results  Component Value Date   HGBA1C 7.9 (H) 11/08/2017    ------------------------------------------------------------------------------------------------------------------ No results for input(s): TSH, T4TOTAL, T3FREE, THYROIDAB in the last 72 hours.  Invalid input(s): FREET3 ------------------------------------------------------------------------------------------------------------------ No results for input(s): VITAMINB12, FOLATE, FERRITIN, TIBC, IRON, RETICCTPCT in the last 72 hours.  Coagulation profile No results for input(s): INR, PROTIME in the last 168 hours.  No results for input(s): DDIMER in the last 72 hours.  Cardiac Enzymes Recent Labs  Lab 11/07/17 1744 11/08/17 0004 11/08/17 0753  TROPONINI 1.77* 2.00* 1.42*   ------------------------------------------------------------------------------------------------------------------    Component Value Date/Time   BNP 557.8 (H) 11/11/2017 0450    Micro Results Recent Results (from the past 240 hour(s))  MRSA PCR Screening     Status: None   Collection Time: 11/07/17  1:41 PM  Result Value Ref Range Status   MRSA by PCR NEGATIVE NEGATIVE Final    Comment:        The GeneXpert MRSA Assay (FDA approved for NASAL specimens only), is one component of a comprehensive MRSA colonization surveillance program. It is not intended to diagnose MRSA infection nor to guide or monitor treatment for MRSA infections. Performed at [facility], [address], [place], [place] [address]     Radiology Reports Dg Chest 2 View  Result Date: 11/11/2017 CLINICAL DATA:  Severe dyspnea.  Prior cardiac surgery. EXAM: CHEST - 2 VIEW COMPARISON:  11/11/2017 CXR, chest CT 05/26/2013 FINDINGS: Interval increase in loculated right-sided pleural effusion greatest along the posterior aspect of the right hemithorax as suggested on the lateral view and along the right upper thorax laterally. Small left effusion. Cardiomegaly stable. Nonaneurysmal appearing thoracic aorta. The patient is status  post median sternotomy and CABG. Calcified pleural plaque at the left lung base. There is some bibasilar streaky atelectatic change. Degenerative changes are present about the thoracic spine. IMPRESSION: 1. Moderate to large loculated pleural effusion along the posterior and right upper lateral aspect of the thorax. Fluid is also seen extending into the minor fissure. 2. 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 3. Stable cardiomegaly. Electronically Signed   By: Tollie Eth M.D.   On: 11/11/2017 17:34   Dg Chest Port 1 View  Result Date: 11/11/2017 CLINICAL DATA:  62 year old male with shortness of breath and nonproductive cough. DKA, N-STEMI. EXAM: PORTABLE CHEST 1 VIEW COMPARISON:  11/09/2017 and earlier. FINDINGS: Portable AP upright view at 0930 hours. Extubated and enteric tube removed. Left subclavian central line removed. Continued right greater than left veiling lung opacity compatible with pleural effusion which is probably at least partially loculated on the right. Lower lung volumes since 11/07/2017. Stable mediastinal contour. No consolidation or pneumothorax. Negative visible bowel gas pattern. Prior sternotomy. IMPRESSION: 1. Extubated, left subclavian line, and enteric tube removed. 2. Lower lung volumes with persistent bilateral pleural effusions, suspected to be partially loculated on the right. Upright PA and lateral views would be helpful when possible. Electronically Signed   By: Odessa Fleming M.D.   On: 11/11/2017 09:40   Dg Chest Port 1 View  Result Date: 11/09/2017 CLINICAL DATA:  Respiratory failure. EXAM: PORTABLE CHEST 1 VIEW COMPARISON:  Chest x-rays dated 11/08/2017, 11/07/2017 and 02/07/2015 FINDINGS: Endotracheal tube tip is approximately 3.5 cm above the carina. Central line tip is at the cavoatrial junction. NG tube tip is below the diaphragm.  Large loculated right pleural effusion persists, slightly increased. Pulmonary vascularity is  now normal with no residual pulmonary edema. Slight atelectasis at the left base has almost completely resolved. Chronic lateral pleural thickening on the left. CABG.  No acute bone abnormality. IMPRESSION: 1. Resolution of pulmonary vascular congestion. 2. Slightly increased large loculated right pleural effusion. 3. Improved minimal atelectasis at the left lung base. Electronically Signed   By: Francene Boyers M.D.   On: 11/09/2017 07:31   Dg Chest Port 1 View  Result Date: 11/08/2017 CLINICAL DATA:  Respiratory failure EXAM: PORTABLE CHEST 1 VIEW COMPARISON:  11/07/2017 FINDINGS: Cardiac shadow is enlarged but stable. Left subclavian central line is noted in satisfactory position. Endotracheal tube and nasogastric catheter are again seen in satisfactory position. Right-sided pleural effusion is again noted but appears slightly improved although this may be positional in nature. Small left effusion is noted as well. No focal confluent infiltrate is seen. IMPRESSION: Bilateral pleural effusions right greater than left. No focal infiltrate is seen. Tubes and lines as described. Electronically Signed   By: Alcide Clever M.D.   On: 11/08/2017 08:40   Dg Chest Port 1 View  Result Date: 11/07/2017 CLINICAL DATA:  Intubated EXAM: PORTABLE CHEST 1 VIEW COMPARISON:  None. FINDINGS: Endotracheal tube tip is 2.6 cm above the carina. Enteric tube terminates in the gastric fundus. Left subclavian central venous catheter terminates at the cavoatrial junction. Intact sternotomy wires. Low lung volumes. Top-normal heart size. Otherwise normal mediastinal contour. No pneumothorax. Bilateral pleural thickening/effusions, most prominent in the upper right pleural space and along the right fissures. Hazy parahilar and bibasilar opacities in both lungs. IMPRESSION: 1. Well-positioned support structures.  No pneumothorax. 2. Bilateral pleural thickening/effusions, most prominent in the upper right pleural space and along the  right fissures. 3. Hazy parahilar and bibasilar opacities in both lungs, which could represent atelectasis, aspiration or pneumonia. Electronically Signed   By: Delbert Phenix M.D.   On: 11/07/2017 16:48   Dg Chest Portable 1 View  Result Date: 11/07/2017 CLINICAL DATA:  62 year old male with shortness of breath for 5 days. Nausea vomiting. EXAM: PORTABLE CHEST 1 VIEW COMPARISON:  [facility] portable chest x-ray 02/07/2015 and earlier. FINDINGS: Portable AP semi upright view at 1050 hours. Stable sequelae of median sternotomy. Cardiac and mediastinal contours are within normal limits. Visualized tracheal air column is within normal limits. Mixed veiling and confluent opacity in the right lung appears at least in part due to loculated pleural effusion. Streaky opacity at the left lung base more resembling atelectasis. No superimposed pneumothorax. Pulmonary vascularity appears within normal limits. Negative visible bowel gas pattern. No acute osseous abnormality identified. IMPRESSION: 1. Abnormal right lung opacity, new since 2016, appears at least in part related to loculated pleural effusion. 2. Left lung base atelectasis or scarring suspected. Electronically Signed   By: Odessa Fleming M.D.   On: 11/07/2017 11:20    Time Spent in minutes  30   Susa Raring M.D on 11/12/2017 at 11:09 AM  To page go to [email or web address] - password [facility]

## 2017-11-12 NOTE — Interval H&P Note (Signed)
History and Physical Interval Note:  11/12/2017 4:44 PM  Brian Miller  has presented today for surgery, with the diagnosis of cp  The various methods of treatment have been discussed with the patient and family. After consideration of risks, benefits and other options for treatment, the patient has consented to  Procedure(s): LEFT HEART CATH AND CORS/GRAFTS ANGIOGRAPHY (N/A) as a surgical intervention .  The patient's history has been reviewed, patient examined, no change in status, stable for surgery.  I have reviewed the patient's chart and labs.  Questions were answered to the patient's satisfaction.   Cath Lab Visit (complete for each Cath Lab visit)  Clinical Evaluation Leading to the Procedure:   ACS: Yes.    Non-ACS:    Anginal Classification: CCS III  Anti-ischemic medical therapy: Minimal Therapy (1 class of medications)  Non-Invasive Test Results: No non-invasive testing performed  Prior CABG: Previous CABG        Theron Arista [facility] 11/12/2017 4:45 PM

## 2017-11-12 NOTE — Progress Notes (Signed)
13

## 2017-11-12 NOTE — Progress Notes (Signed)
Progress Note  Patient Name: Brian Miller Date of Encounter: 11/12/2017  Primary Cardiologist: No primary care provider on file.  Primary cardiologist is at [facility]   Subjective   Cough is markedly improved.  No fevers chills nausea vomiting  Inpatient Medications    Scheduled Meds: . aspirin  81 mg Per Tube Daily  . benzonatate  200 mg Oral TID  . clopidogrel  75 mg Oral Daily  . feeding supplement (GLUCERNA SHAKE)  237 mL Oral TID BM  . heparin injection (subcutaneous)  5,000 Units Subcutaneous Q8H  . Influenza vac split quadrivalent PF  0.5 mL Intramuscular Tomorrow-1000  . insulin aspart  0-15 Units Subcutaneous Q4H  . insulin detemir  30 Units Subcutaneous Q12H  . metoprolol succinate  25 mg Oral Daily  . rosuvastatin  20 mg Oral q1800   Continuous Infusions:  PRN Meds: acetaminophen (TYLENOL) oral liquid 160 mg/5 mL, bisacodyl, docusate, guaiFENesin-codeine   Vital Signs    Vitals:   11/11/17 1349 11/11/17 2157 11/12/17 0300 11/12/17 1228  BP: 138/66 (!) 133/52 (!) 136/54 (!) 122/54  Pulse: 67 66 (!) 53 62  Resp: 18 18 18 18   Temp: 97.9 F (36.6 C) 97.6 F (36.4 C) 99.6 F (37.6 C) 98.7 F (37.1 C)  TempSrc: Oral Oral Oral   SpO2: 93% 94%  98%  Weight:   81.2 kg   Height:        Intake/Output Summary (Last 24 hours) at 11/12/2017 1238 Last data filed at 11/12/2017 1153 Gross per 24 hour  Intake 322 ml  Output 2810 ml  Net -2488 ml   Filed Weights   11/10/17 1047 11/11/17 0507 11/12/17 0300  Weight: 82.9 kg 82.6 kg 81.2 kg    Telemetry    Sinus bradycardia with occasional PACs- Personally Reviewed  ECG    No new- Personally Reviewed  Physical Exam   GEN: No acute distress.   Neck: No JVD Cardiac: RRR, no murmurs, rubs, or gallops.  Respiratory: Clear to auscultation bilaterally.  Minimal cough at times with deep respiration GI: Soft, nontender, non-distended  MS: No edema; No deformity. Neuro:   Nonfocal  Psych: Normal affect   Labs    Chemistry Recent Labs  Lab 11/07/17 0947  11/08/17 0753 11/10/17 0303 11/11/17 0450 11/12/17 0459  NA 136   < > 136 138 137 131*  K 3.9   < > 3.0* 3.4* 3.3* 4.1  CL 95*   < > 107 107 104 95*  CO2 15*   < > 19* 21* 25 25  GLUCOSE 302*   < > 181* 111* 45* 240*  BUN 46*   < > 43* 28* 17 16  CREATININE 1.41*   < > 1.17 0.85 0.73 0.85  CALCIUM 8.8*   < > 7.9* 7.5* 7.8* 7.7*  PROT 7.9  --  5.6*  --   --   --   ALBUMIN 3.3*  --  2.2*  --   --   --   AST 20  --  21  --   --   --   ALT 16  --  14  --   --   --   ALKPHOS 82  --  59  --   --   --   BILITOT 1.3*  --  0.7  --   --   --   GFRNONAA 52*   < > >60 >60 >60 >60  GFRAA >60   < > >60 >  60 >60 >60  ANIONGAP 26*   < > 10 10 8 11    < > = values in this interval not displayed.     Hematology Recent Labs  Lab 11/10/17 0845 11/11/17 0450 11/12/17 0459  WBC DUPLICATE  REFER [id number] 11.9* 10.7*  RBC DUPLICATE  REFER [id number] 3.52* 3.76*  HGB DUPLICATE  REFER X10552 10.8* 11.2*  HCT DUPLICATE  REFER X10552 32.4* 34.3*  MCV DUPLICATE  REFER X10552 92.0 91.2  MCH DUPLICATE  REFER X10552 30.7 29.8  MCHC DUPLICATE  REFER X10552 33.3 32.7  RDW DUPLICATE  REFER X10552 12.9 12.6  PLT DUPLICATE  REFER X10552 381 403*    Cardiac Enzymes Recent Labs  Lab 11/07/17 1353 11/07/17 1744 11/08/17 0004 11/08/17 0753  TROPONINI 0.59* 1.77* 2.00* 1.42*   No results for input(s): TROPIPOC in the last 168 hours.   BNP Recent Labs  Lab 11/07/17 0947 11/11/17 0450  BNP 129.3* 557.8*     DDimer No results for input(s): DDIMER in the last 168 hours.   Radiology    Dg Chest 2 View  Result Date: 11/11/2017 CLINICAL DATA:  Severe dyspnea.  Prior cardiac surgery. EXAM: CHEST - 2 VIEW COMPARISON:  11/11/2017 CXR, chest CT 05/26/2013 FINDINGS: Interval increase in loculated right-sided pleural effusion greatest along the posterior aspect of the right hemithorax as suggested on the lateral view and  along the right upper thorax laterally. Small left effusion. Cardiomegaly stable. Nonaneurysmal appearing thoracic aorta. The patient is status post median sternotomy and CABG. Calcified pleural plaque at the left lung base. There is some bibasilar streaky atelectatic change. Degenerative changes are present about the thoracic spine. IMPRESSION: 1. Moderate to large loculated pleural effusion along the posterior and right upper lateral aspect of the thorax. Fluid is also seen extending into the minor fissure. 2. 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 3. Stable cardiomegaly. Electronically Signed   By: Tollie Eth M.D.   On: 11/11/2017 17:34   Dg Chest Port 1 View  Result Date: 11/11/2017 CLINICAL DATA:  62 year old male with shortness of breath and nonproductive cough. DKA, N-STEMI. EXAM: PORTABLE CHEST 1 VIEW COMPARISON:  11/09/2017 and earlier. FINDINGS: Portable AP upright view at 0930 hours. Extubated and enteric tube removed. Left subclavian central line removed. Continued right greater than left veiling lung opacity compatible with pleural effusion which is probably at least partially loculated on the right. Lower lung volumes since 11/07/2017. Stable mediastinal contour. No consolidation or pneumothorax. Negative visible bowel gas pattern. Prior sternotomy. IMPRESSION: 1. Extubated, left subclavian line, and enteric tube removed. 2. Lower lung volumes with persistent bilateral pleural effusions, suspected to be partially loculated on the right. Upright PA and lateral views would be helpful when possible. Electronically Signed   By: Odessa Fleming M.D.   On: 11/11/2017 09:40    Cardiac Studies   Cardiac catheterization 2015- occluded SVG to OM, patent LIMA to LAD, had PCI to left circumflex  Patient Profile     62 y.o. male here with non-ST elevation myocardial infarction, possibly type II MI in setting of DKA with known CAD post CABG LIMA to LAD, SVG to  OM is occluded status post stent to the left circumflex in October 2015.  Assessment & Plan     Non-ST elevation myocardial infarction-likely type II MI in the setting of underlying medical illness -Peak troponin 2, marked ST segment depression on ECG 11/07/2017. -His cough is markedly improved.  We will go ahead and place him  on add on board for cardiac catheterization today.  He is n.p.o.  Last heart cath, left radial.  Only 2 grafts were reported from prior cardiology note reviewed from care everywhere, LIMA to LAD and SVG to obtuse marginal.  Need to shoot LIMA to LAD graft only.  His SVG to obtuse marginal is occluded. -Prior circumflex stent. - Marked ST segment depression was noted on ECG 11/07/2017.  CAD post CABG 2002 LIMA to LAD, SVG to circumflex - SVG to OM is occluded.  Native circumflex was stented in 2015.  DKA - Resolved.  Post intubation.  Acute kidney injury -Resolved.  Creatinine back to baseline.      For questions or updates, please contact [facility] Please consult [email or web address] for contact info under        Signed, Donato Schultz, MD  11/12/2017, 12:38 PM

## 2017-11-12 NOTE — Progress Notes (Signed)
Inpatient Diabetes Program Recommendations  AACE/ADA: New Consensus Statement on Inpatient Glycemic Control (2015)  Target Ranges:  Prepandial:   less than 140 mg/dL      Peak postprandial:   less than 180 mg/dL (1-2 hours)      Critically ill patients:  140 - 180 mg/dL   Lab Results  Component Value Date   GLUCAP 194 (H) 11/12/2017   HGBA1C 7.9 (H) 11/08/2017    Review of Glycemic ControlResults for RASHIEM, ADMIRE (MRN [id number]) as of 11/12/2017 15:08  Ref. Range 11/11/2017 11:53 11/11/2017 16:45 11/11/2017 21:59 11/12/2017 07:44 11/12/2017 12:12  Glucose-Capillary Latest Ref Range: 70 - 99 mg/dL 536 (H) 144 (H) 315 (H) 244 (H) 194 (H)   Diabetes history:DM2 Outpatient Diabetes medications:Jardiance 25 mg daily, Amaryl 2 mg BID,Lantus 80 units daily, Metformin 1000 mg BID Current orders for Inpatient glycemic control:Levemir30units BID, Novolog 0-15 units q 4 hours Inpatient Diabetes Program Recommendations:   Attempted to speak with patient.  He wrote on a pad of paper noting that talking makes him cough.  He see's Dr. Allena Katz (endocrinolgist) in [facility] and notes that his A1C's are usually in the 7% range.  He states that he became sick and continued taking his medication for DM until the last 2 days prior to hospitalization. We discussed that Jardiance can sometimes increase the risk of DKA and dehydration. Encouraged follow-up with Dr. Allena Katz. Patient states he has no questions regarding his diabetes at this time.  -Consider d/c of Jardiance at discharge and follow-up with endocrinologist due to DKA.  Thanks, Beryl Meager, RN, BC-ADM Inpatient Diabetes Coordinator Pager [phone] (8a-5p)

## 2017-11-13 ENCOUNTER — Inpatient Hospital Stay (HOSPITAL_COMMUNITY): Payer: BLUE CROSS/BLUE SHIELD

## 2017-11-13 ENCOUNTER — Encounter (HOSPITAL_COMMUNITY): Payer: Self-pay | Admitting: Cardiology

## 2017-11-13 DIAGNOSIS — I21A1 Myocardial infarction type 2: Secondary | ICD-10-CM

## 2017-11-13 DIAGNOSIS — E1010 Type 1 diabetes mellitus with ketoacidosis without coma: Secondary | ICD-10-CM

## 2017-11-13 DIAGNOSIS — J90 Pleural effusion, not elsewhere classified: Secondary | ICD-10-CM

## 2017-11-13 LAB — GLUCOSE, CAPILLARY
GLUCOSE-CAPILLARY: 150 mg/dL — AB (ref 70–99)
GLUCOSE-CAPILLARY: 272 mg/dL — AB (ref 70–99)
Glucose-Capillary: 175 mg/dL — ABNORMAL HIGH (ref 70–99)
Glucose-Capillary: 228 mg/dL — ABNORMAL HIGH (ref 70–99)
Glucose-Capillary: 234 mg/dL — ABNORMAL HIGH (ref 70–99)

## 2017-11-13 LAB — CBC
HCT: 32.2 % — ABNORMAL LOW (ref 39.0–52.0)
Hemoglobin: 10.6 g/dL — ABNORMAL LOW (ref 13.0–17.0)
MCH: 30 pg (ref 26.0–34.0)
MCHC: 32.9 g/dL (ref 30.0–36.0)
MCV: 91.2 fL (ref 78.0–100.0)
PLATELETS: 455 10*3/uL — AB (ref 150–400)
RBC: 3.53 MIL/uL — AB (ref 4.22–5.81)
RDW: 12.5 % (ref 11.5–15.5)
WBC: 11.8 10*3/uL — AB (ref 4.0–10.5)

## 2017-11-13 LAB — BASIC METABOLIC PANEL
Anion gap: 9 (ref 5–15)
BUN: 15 mg/dL (ref 8–23)
CO2: 26 mmol/L (ref 22–32)
Calcium: 7.9 mg/dL — ABNORMAL LOW (ref 8.9–10.3)
Chloride: 97 mmol/L — ABNORMAL LOW (ref 98–111)
Creatinine, Ser: 0.98 mg/dL (ref 0.61–1.24)
GFR calc non Af Amer: >60 mL/min (ref >60)
Glucose, Bld: 239 mg/dL — ABNORMAL HIGH (ref 70–99)
POTASSIUM: 4.5 mmol/L (ref 3.5–5.1)
SODIUM: 132 mmol/L — AB (ref 135–145)

## 2017-11-13 LAB — MAGNESIUM: Magnesium: 1.8 mg/dL (ref 1.7–2.4)

## 2017-11-13 NOTE — Progress Notes (Signed)
TR BAND REMOVAL  LOCATION:    left radial  DEFLATED PER PROTOCOL:    Yes.    TIME BAND OFF / DRESSING APPLIED:    20:00   SITE UPON ARRIVAL:    Level 0  SITE AFTER BAND REMOVAL:    Level 0  CIRCULATION SENSATION AND MOVEMENT:    Within Normal Limits   Yes.    COMMENTS:   Post TR band instructions given. Pt tolerated well.

## 2017-11-13 NOTE — Progress Notes (Addendum)
Progress Note  Patient Name: Alvon Nygaard Date of Encounter: 11/13/2017  Primary Cardiologist: [facility]   Subjective   Pt feeling well today. No chest pain or palpitations. Cath site unremarkable.   Inpatient Medications    Scheduled Meds: . aspirin  81 mg Per Tube Daily  . benzonatate  200 mg Oral TID  . clopidogrel  75 mg Oral Daily  . enoxaparin (LOVENOX) injection  40 mg Subcutaneous Q24H  . feeding supplement (GLUCERNA SHAKE)  237 mL Oral TID BM  . Influenza vac split quadrivalent PF  0.5 mL Intramuscular Tomorrow-1000  . insulin aspart  0-15 Units Subcutaneous TID WC  . insulin aspart  0-5 Units Subcutaneous QHS  . insulin detemir  30 Units Subcutaneous Q12H  . metoprolol succinate  25 mg Oral Daily  . rosuvastatin  20 mg Oral q1800  . sodium chloride flush  3 mL Intravenous Q12H   Continuous Infusions: . sodium chloride     PRN Meds: sodium chloride, acetaminophen (TYLENOL) oral liquid 160 mg/5 mL, bisacodyl, docusate, guaiFENesin-codeine, sodium chloride flush   Vital Signs    Vitals:   11/12/17 2000 11/12/17 2100 11/13/17 0548 11/13/17 0703  BP: (!) 142/53 (!) 131/41 (!) 129/56 130/68  Pulse: 70 78 (!) 57 (!) 56  Resp: (!) 25 (!) 33 (!) 23 (!) 22  Temp:   98.2 F (36.8 C)   TempSrc:   Oral   SpO2: 94% 94% 97% 97%  Weight:   80.6 kg   Height:        Intake/Output Summary (Last 24 hours) at 11/13/2017 0842 Last data filed at 11/13/2017 0700 Gross per 24 hour  Intake 1724.63 ml  Output 1500 ml  Net 224.63 ml   Filed Weights   11/11/17 0507 11/12/17 0300 11/13/17 0548  Weight: 82.6 kg 81.2 kg 80.6 kg   Physical Exam   General: Frail,  NAD Skin: Warm, dry, intact  Head: Normocephalic, atraumatic, clear, moist mucus membranes. Neck: Negative for carotid bruits. No JVD Lungs: Diminished in right upper and lower lobes. No wheezes, rales, or rhonchi. Breathing is unlabored. Cardiovascular: RRR with S1 S2. No  murmurs, rubs, gallops, or LV heave appreciated. Abdomen: Soft, non-tender, non-distended with normoactive bowel sounds. No obvious abdominal masses. MSK: Strength and tone appear normal for age. 5/5 in all extremities Extremities: No edema. No clubbing or cyanosis. DP/PT pulses 2+ bilaterally Neuro: Alert and oriented. No focal deficits. No facial asymmetry. MAE spontaneously. Psych: Responds to questions appropriately with normal affect.    Labs    Chemistry Recent Labs  Lab 11/07/17 [phone]  11/08/17 0753  11/11/17 0450 11/12/17 0459 11/13/17 0211  NA 136   < > 136   < > 137 131* 132*  K 3.9   < > 3.0*   < > 3.3* 4.1 4.5  CL 95*   < > 107   < > 104 95* 97*  CO2 15*   < > 19*   < > 25 25 26   GLUCOSE 302*   < > 181*   < > 45* 240* 239*  BUN 46*   < > 43*   < > 17 16 15   CREATININE 1.41*   < > 1.17   < > 0.73 0.85 0.98  CALCIUM 8.8*   < > 7.9*   < > 7.8* 7.7* 7.9*  PROT 7.9  --  5.6*  --   --   --   --   ALBUMIN 3.3*  --  2.2*  --   --   --   --   AST 20  --  21  --   --   --   --   ALT 16  --  14  --   --   --   --   ALKPHOS 82  --  59  --   --   --   --   BILITOT 1.3*  --  0.7  --   --   --   --   GFRNONAA 52*   < > >60   < > >60 >60 >60  GFRAA >60   < > >60   < > >60 >60 >60  ANIONGAP 26*   < > 10   < > 8 11 9    < > = values in this interval not displayed.     Hematology Recent Labs  Lab 11/11/17 0450 11/12/17 0459 11/13/17 0211  WBC 11.9* 10.7* 11.8*  RBC 3.52* 3.76* 3.53*  HGB 10.8* 11.2* 10.6*  HCT 32.4* 34.3* 32.2*  MCV 92.0 91.2 91.2  MCH 30.7 29.8 30.0  MCHC 33.3 32.7 32.9  RDW 12.9 12.6 12.5  PLT 381 403* 455*    Cardiac Enzymes Recent Labs  Lab 11/07/17 1353 11/07/17 1744 11/08/17 0004 11/08/17 0753  TROPONINI 0.59* 1.77* 2.00* 1.42*   No results for input(s): TROPIPOC in the last 168 hours.   BNP Recent Labs  Lab 11/07/17 0947 11/11/17 0450  BNP 129.3* 557.8*    DDimer No results for input(s): DDIMER in the last 168 hours.   Radiology      Dg Chest 2 View  Result Date: 11/11/2017 CLINICAL DATA:  Severe dyspnea.  Prior cardiac surgery. EXAM: CHEST - 2 VIEW COMPARISON:  11/11/2017 CXR, chest CT 05/26/2013 FINDINGS: Interval increase in loculated right-sided pleural effusion greatest along the posterior aspect of the right hemithorax as suggested on the lateral view and along the right upper thorax laterally. Small left effusion. Cardiomegaly stable. Nonaneurysmal appearing thoracic aorta. The patient is status post median sternotomy and CABG. Calcified pleural plaque at the left lung base. There is some bibasilar streaky atelectatic change. Degenerative changes are present about the thoracic spine. IMPRESSION: 1. Moderate to large loculated pleural effusion along the posterior and right upper lateral aspect of the thorax. Fluid is also seen extending into the minor fissure. 2. 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 3. Stable cardiomegaly. Electronically Signed   By: Tollie Eth M.D.   On: 11/11/2017 17:34   Dg Chest Port 1 View  Result Date: 11/11/2017 CLINICAL DATA:  62 year old male with shortness of breath and nonproductive cough. DKA, N-STEMI. EXAM: PORTABLE CHEST 1 VIEW COMPARISON:  11/09/2017 and earlier. FINDINGS: Portable AP upright view at 0930 hours. Extubated and enteric tube removed. Left subclavian central line removed. Continued right greater than left veiling lung opacity compatible with pleural effusion which is probably at least partially loculated on the right. Lower lung volumes since 11/07/2017. Stable mediastinal contour. No consolidation or pneumothorax. Negative visible bowel gas pattern. Prior sternotomy. IMPRESSION: 1. Extubated, left subclavian line, and enteric tube removed. 2. Lower lung volumes with persistent bilateral pleural effusions, suspected to be partially loculated on the right. Upright PA and lateral views would be helpful when possible. Electronically  Signed   By: Odessa Fleming M.D.   On: 11/11/2017 09:40   Telemetry    11/13/17 NSR with HR 70's - Personally Reviewed  ECG    No new tracings  as of 11/13/2017- Personally Reviewed  Cardiac Studies   Cardiac catheterization 11/12/2017:  Dist RCA lesion is 70% stenosed.  Previously placed Ost Cx to Mid Cx stent (unknown type) is widely patent.  Ost LAD to Prox LAD lesion is 100% stenosed.  LIMA graft was visualized by angiography and is large.  The graft exhibits no disease.  SVG graft was not injected.  Origin to Prox Graft lesion is 100% stenosed.  Ost 1st Mrg to 1st Mrg lesion is 100% stenosed.  Lat 2nd Mrg lesion is 100% stenosed.  Ost 3rd Mrg to 3rd Mrg lesion is 95% stenosed.  Ost LPDA to LPDA lesion is 70% stenosed.  The left ventricular systolic function is normal.  LV end diastolic pressure is normal.  The left ventricular ejection fraction is 55-65% by visual estimate.   1. 3 vessel obstructive CAD    - 100% ostial LAD    - The stent in the LCx is widely patent but all the OM branches are small and diffusely severely diseased or occluded.    - 70% RCA at the crux 2. Patent LIMA to the LAD 3. Known occluded SVG to OM 4. Normal LV function 5. Normal LVEDP  Plan: I do not see an acute coronary lesion. The OMs are chronically diseased and not amenable to PCI. The RCA has a modest stenosis but in the absence of symptoms I would not recommend PCI. I believe his troponin elevation is a type 2 event with acute medical illness. Continue medical management.  Echocardiogram 11/07/2017: Study Conclusions  - Left ventricle: The cavity size was normal. Wall thickness was   increased in a pattern of mild LVH. Systolic function was normal.   The estimated ejection fraction was in the range of 55% to 60%. - Aortic valve: There was mild regurgitation. - Mitral valve: Calcified annulus. There was mild regurgitation.  Patient Profile     62 y.o. male with non-ST  elevation myocardial infarction, possibly type II MI in setting of DKA with known CAD post CABG LIMA to LAD, SVG to OM is occluded status post stent to the left circumflex in October 2015.  Assessment & Plan    1.  NSTEMI: -Peak troponin, 2.00 with marked ST segment depression on EKG 11/07/2017 -Cardiac catheterization on 11/12/2017 revealed no obstructive three-vessel CAD with 100% ostial LAD, stent to the LCx noted to be widely patent but all the OM branches are small and diffusely diseased or occluded.  There is noted to be a 70% RCA at the crux.  Patent LIMA to LAD.  Known occluded SVG to OM.  Normal LV function and normal LVEDP.  Per cath note, no acute coronary lesion and OM's are chronically diseased and not amenable to PCI.  A modest RCA stenosis noted however in the absence of symptoms would not recommend PCI per cath note.  Recommendations are for continued medical management. -Continue ASA, Plavix, Toprol XL, rosuvastatin  2.  CAD with CABG (2002) and previous stenting to SVG to LCx (2015): -SVG to OM with known occlusion -See cath report and medical management plans above  3.  DKA with acute respiratory failure: -Resolved>> stable post intubation  4.  AKI: -Resolved, creatinine 0.98 post cardiac cath today.  Slightly up from 0.85 yesterday -Continue to avoid nephrotoxic medications and monitor renal function closely  5. Cough: -Diminished in right upper and lower lobes on exam -Consider repeat CXR for comparative evaluation  -Last CXR from 11/11/17 with moderate to large loculated pleural  effusion along the posterior and right upper lateral aspect of the thorax. Fluid is also seen extending into the minor fissure -WBC elevated at 11.8 -PRN guaifenesin   Signed, Georgie Chard NP-[facility] Pager: [phone] 11/13/2017, 8:42 AM     For questions or updates, please contact   Please consult [email or web address] for contact info under Cardiology/STEMI.  Personally seen and  examined. Agree with above.  Overall reassuring stable cardiac catheterization with no changes from last 2.  Continue with medical management as he is currently being prescribed.  Cath site normal.  He is alert and oriented x3.  Regular rate and rhythm.  Skin is warm. Lab work personally reviewed.  Type II myocardial infarction - Elevated troponin secondary to underlying demand ischemia from his known coronary disease.  No plaque rupture noted.  Continue with medical management.  Follow-up with his cardiologist at [facility].  Okay with discharge from my perspective  Other issues reviewed as above.  Continue with secondary prevention efforts.  [facility] will sign off.   Medication Recommendations: Continue current meds Other recommendations (labs, testing, etc): None Follow up as an outpatient: [facility], they can offer him cardiac rehab as well if they feel necessary.

## 2017-11-13 NOTE — Progress Notes (Signed)
PROGRESS NOTE  Brian Miller ZOX:[id number] DOB: 1955/11/26 DOA: 11/07/2017 PCP: Jolene Provost, MD   LOS: 6 days   Brief Narrative / Interim history: Brian Miller is a 62 y/o male with medical history significant for CAD, DM2, HTN, and dyslipidemia who presented to [facility] 9/19 with nausea, vomiting, SOB and productive cough x5 days. Intial labs were obtained out of concern for DKA from dehydration and GI losses and fluid resuscitation was initiated. Labs significant for acidosis 7.24, anion gap 26, bicarb 15, CO2 15, BUN 46, , Cr 1.41, WBC 13.7, lactic acid 4.39. BNP 298. Troponin 0.59. CXR showed abnormal right lung opacity possibly related to loculated pleural effusion. EKG sinus rhythm with T wave changes of unknown acuity. Following IVF pt developed acute hypoxic respiratory failure and was transferred to [facility] and admitted to ICU requiring intubation for DKA, acute hypoxic respiratory failure with concern for aspiration pneumonia, and rule out cardiac ischemia. Post intubation 9/21 patient has developed persistent cough. Cardiology has been following with work up.  Subjective: Patient states he is feeling better and almost back to baseline, however still is having persistent cough. Says it is sometimes productive with white sputum. Says he only feels SOB after coughing.  Is on 2L [place]. No chest pain, LE edema, abdominal pain.  Assessment & Plan: Active Problems:   DKA (diabetic ketoacidoses) (HCC)   Acute respiratory failure (HCC)   Acute coronary syndrome (HCC)  Demand ischemia - S/p intubation 9/21 - Pt history of CAD and prior CABG  - Cardiology consulted with working diagnosis of NSTEMI due to ST segment depression on EKG 9/19 and peak troponin 2.00 vs possible acute on chronic diastolic heart failure - TEE 9/19 LVEF 55-60% mild LVH - Cardiac cath 9/24 revealed 3 vessel obstructive CAD; no acute coronary lesion noted and does not recommend PCI, recommend continue medical  management - Cardiology saw patient today and believed to be demand ischemia- continue ASA, Plavix, Toprol XL, rosuvastatin - Continue to wean off [place] oxygen   DKA with respiratory failure, DM type 2 - AG closed and resolved, extubated 9/21 - On SSI and monitoring CBGs  Acute hypoxic respiratory failure, Loculated pleural effusion, rule out aspiration pneumonia - Started on Unasyn 9/19 for suspected aspiration pneumonia d/c'ed 9/21 - Patient continues to have persistent cough - CXR 9/23 shows loculated pleural effusion along posterior and right upper lateral as well as fluid in minor fissure and 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 aspirin  81 mg Per Tube Daily  . benzonatate  200 mg Oral TID  . clopidogrel  75 mg Oral Daily  . enoxaparin (LOVENOX) injection  40 mg Subcutaneous Q24H  . feeding supplement (GLUCERNA SHAKE)  237 mL Oral TID BM  . Influenza vac split quadrivalent PF  0.5 mL Intramuscular Tomorrow-1000  . insulin aspart  0-15 Units Subcutaneous TID WC  . insulin aspart  0-5 Units Subcutaneous QHS  . insulin detemir  30 Units Subcutaneous Q12H  . metoprolol succinate  25 mg Oral Daily  . rosuvastatin  20 mg Oral q1800  . sodium chloride flush  3 mL Intravenous Q12H   Continuous Infusions: . sodium chloride     PRN Meds:.sodium chloride, acetaminophen (TYLENOL) oral liquid 160 mg/5 mL, bisacodyl, docusate, guaiFENesin-codeine, sodium chloride flush  DVT prophylaxis: Heparin Code Status: full Family Communication: none at  bedside Disposition Plan: pending clinical improvement  Consultants:   Cardiology  PCCM  Procedures:   TTE: 9/19  Left heart cath 9/24  Antimicrobials:  Unasyn 9/19-9/21  Ceftriaxone 9/19  Objective: Vitals:   11/12/17 2000 11/12/17 2100 11/13/17 0548 11/13/17 0703  BP: (!) 142/53 (!) 131/41 (!) 129/56 130/68  Pulse: 70 78 (!) 57 (!) 56  Resp: (!) 25 (!) 33 (!) 23 (!) 22  Temp:   98.2 F (36.8 C)   TempSrc:   Oral   SpO2: 94% 94% 97% 97%  Weight:   80.6 kg   Height:        Intake/Output Summary (Last 24 hours) at 11/13/2017 0937 Last data filed at 11/13/2017 0700 Gross per 24 hour  Intake 1724.63 ml  Output 1200 ml  Net 524.63 ml   Filed Weights   11/11/17 0507 11/12/17 0300 11/13/17 0548  Weight: 82.6 kg 81.2 kg 80.6 kg    Examination:  Constitutional: Lying comfortably in bed, NAD Respiratory: diffusly diminished breath sounds on the right, normal respiratory effort  Cardiovascular: Regular rate and rhythm, no murmurs  Abdomen: soft, non tender Musculoskeletal: no clubbing or cyanosis Skin: no rashes, lesions, ulcers Neurologic: alert and oriented x3, non focal  Psychiatric: Normal mood and affect   Data Reviewed: I have independently reviewed following labs and imaging studies   CBC: Recent Labs  Lab 11/07/17 0947  11/10/17 0303 11/10/17 0845 11/11/17 0450 11/12/17 0459 11/13/17 0211  WBC 13.7*   < > 12.3* DUPLICATE  REFER X10552 11.9* 10.7* 11.8*  NEUTROABS 11.5*  --  8.7* PENDING  --   --   --   HGB 15.3   < > 10.3* DUPLICATE  REFER X10552 10.8* 11.2* 10.6*  HCT 44.2   < > 31.4* DUPLICATE  REFER X10552 32.4* 34.3* 32.2*  MCV 88.9   < > 92.1 DUPLICATE  REFER X10552 92.0 91.2 91.2  PLT 436*   < > 350 DUPLICATE  REFER X10552 381 403* 455*   < > = values in this interval not displayed.   Basic Metabolic Panel: Recent Labs  Lab 11/08/17 0753 11/08/17 1019 11/08/17 1618 11/09/17 0500 11/10/17 0303 11/11/17 0450 11/12/17 0459  11/13/17 0211  NA 136  --   --   --  138 137 131* 132*  K 3.0*  --   --   --  3.4* 3.3* 4.1 4.5  CL 107  --   --   --  107 104 95* 97*  CO2 19*  --   --   --  21* 25 25 26   GLUCOSE 181*  --   --   --  111* 45* 240* 239*  BUN 43*  --   --   --  28* 17 16 15   CREATININE 1.17  --   --   --  0.85 0.73 0.85 0.98  CALCIUM 7.9*  --   --   --  7.5* 7.8* 7.7* 7.9*  MG  --  2.6* 2.4 2.5*  --   --  1.6* 1.8  PHOS  --  1.3* 1.1* 3.0  --   --   --   --    GFR: Estimated Creatinine Clearance: 81.7 mL/min (by C-G formula based on SCr  of 0.98 mg/dL). Liver Function Tests: Recent Labs  Lab 11/07/17 0947 11/08/17 0753  AST 20 21  ALT 16 14  ALKPHOS 82 59  BILITOT 1.3* 0.7  PROT 7.9 5.6*  ALBUMIN 3.3* 2.2*   Recent Labs  Lab 11/07/17 0947  LIPASE 50   No results for input(s): AMMONIA in the last 168 hours. Coagulation Profile: No results for input(s): INR, PROTIME in the last 168 hours. Cardiac Enzymes: Recent Labs  Lab 11/07/17 1353 11/07/17 1744 11/08/17 0004 11/08/17 0753  TROPONINI 0.59* 1.77* 2.00* 1.42*   BNP (last 3 results) No results for input(s): PROBNP in the last 8760 hours. HbA1C: No results for input(s): HGBA1C in the last 72 hours. CBG: Recent Labs  Lab 11/12/17 0744 11/12/17 1212 11/12/17 1802 11/12/17 2153 11/13/17 0550  GLUCAP 244* 194* 150* 306* 175*   Lipid Profile: No results for input(s): CHOL, HDL, LDLCALC, TRIG, CHOLHDL, LDLDIRECT in the last 72 hours. Thyroid Function Tests: No results for input(s): TSH, T4TOTAL, FREET4, T3FREE, THYROIDAB in the last 72 hours. Anemia Panel: No results for input(s): VITAMINB12, FOLATE, FERRITIN, TIBC, IRON, RETICCTPCT in the last 72 hours. Urine analysis:    Component Value Date/Time   COLORURINE YELLOW 11/07/2017 1845   APPEARANCEUR CLEAR 11/07/2017 1845   LABSPEC 1.021 11/07/2017 1845   PHURINE 6.0 11/07/2017 1845   GLUCOSEU >=500 (A) 11/07/2017 1845   HGBUR NEGATIVE 11/07/2017 1845   BILIRUBINUR  NEGATIVE 11/07/2017 1845   KETONESUR 80 (A) 11/07/2017 1845   PROTEINUR NEGATIVE 11/07/2017 1845   NITRITE NEGATIVE 11/07/2017 1845   LEUKOCYTESUR NEGATIVE 11/07/2017 1845   Sepsis Labs: Invalid input(s): PROCALCITONIN, LACTICIDVEN  Recent Results (from the past 240 hour(s))  MRSA PCR Screening     Status: None   Collection Time: 11/07/17  1:41 PM  Result Value Ref Range Status   MRSA by PCR NEGATIVE NEGATIVE Final    Comment:        The GeneXpert MRSA Assay (FDA approved for NASAL specimens only), is one component of a comprehensive MRSA colonization surveillance program. It is not intended to diagnose MRSA infection nor to guide or monitor treatment for MRSA infections. Performed at [facility], [address], [place], [place] [address]       Radiology Studies: Dg Chest 2 View  Result Date: 11/11/2017 CLINICAL DATA:  Severe dyspnea.  Prior cardiac surgery. EXAM: CHEST - 2 VIEW COMPARISON:  11/11/2017 CXR, chest CT 05/26/2013 FINDINGS: Interval increase in loculated right-sided pleural effusion greatest along the posterior aspect of the right hemithorax as suggested on the lateral view and along the right upper thorax laterally. Small left effusion. Cardiomegaly stable. Nonaneurysmal appearing thoracic aorta. The patient is status post median sternotomy and CABG. Calcified pleural plaque at the left lung base. There is some bibasilar streaky atelectatic change. Degenerative changes are present about the thoracic spine. IMPRESSION: 1. Moderate to large loculated pleural effusion along the posterior and right upper lateral aspect of the thorax. Fluid is also seen extending into the minor fissure. 2. 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 3. Stable cardiomegaly. Electronically Signed   By: Tollie Eth M.D.   On: 11/11/2017 17:34       Time spent: 25    Valerian Jewel, PA-S [facility]  If 7PM-7AM, please  contact night-coverage [email or web address] Password [facility] 11/13/2017, 9:37 AM   [email or web address] @

## 2017-11-13 NOTE — Progress Notes (Addendum)
PROGRESS NOTE    Brian Miller  WGN:[id number] DOB: 1955-03-16 DOA: 11/07/2017 PCP: Jolene Provost, MD    Brief Narrative:  62 year old male who was admitted with diabetes ketoacidosis.  Transferred from [facility] September 19, patient had nausea, diarrhea, dyspnea and cough.  Apparently he developed acute hypoxic respiratory failure after volume resuscitation.  Patient eventually required invasive mechanical ventilation.  His chest x-ray show a loculated right pleural effusion.  His troponins were elevated.  He was successfully liberated from mechanical ventilation and transferred to the hospital medicine service September 23.    Assessment & Plan:   Active Problems:   DKA (diabetic ketoacidoses) (HCC)   Acute respiratory failure (HCC)   Acute coronary syndrome (HCC)   1.  T2DM with resolved Diabetes ketoacidosis. Continue capillary glucose monitoring, patient is tolerating po well. Fasting glucose today is 239, capillary glucose 172, 272, 228. Continue insulin sliding scale and basal insulin with levimir 30 units.   2.  Demand ischemia with elevated troponin/myocardial infarction type II. Echocardiogram with preserved LV function with no wall motion abnormalities, cardiac catheterization with 3 vessel obstructive CAD. Patent stent in the LCx and patent LIMA to LAD. Continue medical therapy with asa, clopidogrel, metoprolol and rosuvastatin.   3.  Large loculated right pleural effusion. Loculated effusion on the right, patient with signs of asbestos pleural disease, will order Chest CT before thoracentesis, will need further cytology. Completed antibiotic therapy, continue oxymetry monitoring and supplemental 02 per [place].   4.  Acute kidney injury. Improved renal function, will continue to follow on renal panel in am, avoid hypotension or nephrotoxic medications.   5.  Dyslipidemia. Continue statin therapy.    DVT prophylaxis: enoxaparin   Code Status: full Family  Communication: I spoke with patient's wife at the bedside and all questions were addressed. Disposition Plan/ discharge barriers: Pending pulmonary workup.    Consultants:   Cardiology   Procedures:     Antimicrobials:      Subjective: Patient continue to have dyspnea and pleuritic chest pain, positive cough, no nausea or vomiting.   Objective: Vitals:   11/13/17 0703 11/13/17 1118 11/13/17 1133 11/13/17 1517  BP: 130/68 (!) 133/59  (!) 126/56  Pulse: (!) 56 72 72 64  Resp: (!) 22 13 (!) 32 14  Temp:  98 F (36.7 C) 98 F (36.7 C) 97.9 F (36.6 C)  TempSrc:  Oral    SpO2: 97%  92% 96%  Weight:      Height:        Intake/Output Summary (Last 24 hours) at 11/13/2017 1717 Last data filed at 11/13/2017 0700 Gross per 24 hour  Intake 1724.63 ml  Output 400 ml  Net 1324.63 ml   Filed Weights   11/11/17 0507 11/12/17 0300 11/13/17 0548  Weight: 82.6 kg 81.2 kg 80.6 kg    Examination:   General: deconditioned  Neurology: Awake and alert, non focal  E ENT: mild pallor, no icterus, oral mucosa moist Cardiovascular: No JVD. S1-S2 present, rhythmic, no gallops, rubs, or murmurs. No lower extremity edema. Pulmonary: decreased breath sounds at the right, no wheezing, rhonchi or rales. Gastrointestinal. Abdomen with no organomegaly, non tender, no rebound or guarding Skin. No rashes Musculoskeletal: no joint deformities     Data Reviewed: I have personally reviewed following labs and imaging studies  CBC: Recent Labs  Lab 11/07/17 0947  11/10/17 0303 11/10/17 0845 11/11/17 0450 11/12/17 0459 11/13/17 0211  WBC 13.7*   < > 12.3* DUPLICATE  REFER [id number] 11.9* 10.7* 11.8*  NEUTROABS 11.5*  --  8.7* PENDING  --   --   --   HGB 15.3   < > 10.3* DUPLICATE  REFER X10552 10.8* 11.2* 10.6*  HCT 44.2   < > 31.4* DUPLICATE  REFER X10552 32.4* 34.3* 32.2*  MCV 88.9   < > 92.1 DUPLICATE  REFER X10552 92.0 91.2 91.2  PLT 436*   < > 350 DUPLICATE  REFER X10552 381 403*  455*   < > = values in this interval not displayed.   Basic Metabolic Panel: Recent Labs  Lab 11/08/17 0753 11/08/17 1019 11/08/17 1618 11/09/17 0500 11/10/17 0303 11/11/17 0450 11/12/17 0459 11/13/17 0211  NA 136  --   --   --  138 137 131* 132*  K 3.0*  --   --   --  3.4* 3.3* 4.1 4.5  CL 107  --   --   --  107 104 95* 97*  CO2 19*  --   --   --  21* 25 25 26   GLUCOSE 181*  --   --   --  111* 45* 240* 239*  BUN 43*  --   --   --  28* 17 16 15   CREATININE 1.17  --   --   --  0.85 0.73 0.85 0.98  CALCIUM 7.9*  --   --   --  7.5* 7.8* 7.7* 7.9*  MG  --  2.6* 2.4 2.5*  --   --  1.6* 1.8  PHOS  --  1.3* 1.1* 3.0  --   --   --   --    GFR: Estimated Creatinine Clearance: 81.7 mL/min (by C-G formula based on SCr of 0.98 mg/dL). Liver Function Tests: Recent Labs  Lab 11/07/17 0947 11/08/17 0753  AST 20 21  ALT 16 14  ALKPHOS 82 59  BILITOT 1.3* 0.7  PROT 7.9 5.6*  ALBUMIN 3.3* 2.2*   Recent Labs  Lab 11/07/17 0947  LIPASE 50   No results for input(s): AMMONIA in the last 168 hours. Coagulation Profile: No results for input(s): INR, PROTIME in the last 168 hours. Cardiac Enzymes: Recent Labs  Lab 11/07/17 1353 11/07/17 1744 11/08/17 0004 11/08/17 0753  TROPONINI 0.59* 1.77* 2.00* 1.42*   BNP (last 3 results) No results for input(s): PROBNP in the last 8760 hours. HbA1C: No results for input(s): HGBA1C in the last 72 hours. CBG: Recent Labs  Lab 11/12/17 1802 11/12/17 2153 11/13/17 0550 11/13/17 1210 11/13/17 1703  GLUCAP 150* 306* 175* 272* 228*   Lipid Profile: No results for input(s): CHOL, HDL, LDLCALC, TRIG, CHOLHDL, LDLDIRECT in the last 72 hours. Thyroid Function Tests: No results for input(s): TSH, T4TOTAL, FREET4, T3FREE, THYROIDAB in the last 72 hours. Anemia Panel: No results for input(s): VITAMINB12, FOLATE, FERRITIN, TIBC, IRON, RETICCTPCT in the last 72 hours.    Radiology Studies: I have reviewed all of the imaging during this  hospital visit personally     Scheduled Meds: . aspirin  81 mg Per Tube Daily  . benzonatate  200 mg Oral TID  . clopidogrel  75 mg Oral Daily  . enoxaparin (LOVENOX) injection  40 mg Subcutaneous Q24H  . feeding supplement (GLUCERNA SHAKE)  237 mL Oral TID BM  . insulin aspart  0-15 Units Subcutaneous TID WC  . insulin aspart  0-5 Units Subcutaneous QHS  . insulin detemir  30 Units Subcutaneous Q12H  . metoprolol succinate  25 mg Oral Daily  .  rosuvastatin  20 mg Oral q1800  . sodium chloride flush  3 mL Intravenous Q12H   Continuous Infusions: . sodium chloride       LOS: 6 days        Jasmia Angst Annett Gula, MD [facility] Pager [phone]

## 2017-11-14 DIAGNOSIS — J9600 Acute respiratory failure, unspecified whether with hypoxia or hypercapnia: Secondary | ICD-10-CM

## 2017-11-14 LAB — GLUCOSE, CAPILLARY
GLUCOSE-CAPILLARY: 290 mg/dL — AB (ref 70–99)
GLUCOSE-CAPILLARY: 161 mg/dL — AB (ref 70–99)
Glucose-Capillary: 71 mg/dL (ref 70–99)
Glucose-Capillary: 221 mg/dL — ABNORMAL HIGH (ref 70–99)

## 2017-11-14 LAB — CBC
HEMATOCRIT: 32.2 % — AB (ref 39.0–52.0)
Hemoglobin: 10.6 g/dL — ABNORMAL LOW (ref 13.0–17.0)
MCH: 30.3 pg (ref 26.0–34.0)
MCHC: 32.9 g/dL (ref 30.0–36.0)
MCV: 92 fL (ref 78.0–100.0)
Platelets: 445 10*3/uL — ABNORMAL HIGH (ref 150–400)
RBC: 3.5 MIL/uL — AB (ref 4.22–5.81)
RDW: 12.4 % (ref 11.5–15.5)
WBC: 11.1 10*3/uL — AB (ref 4.0–10.5)

## 2017-11-14 NOTE — Progress Notes (Addendum)
PROGRESS NOTE    Brian Miller  ZOX:[id number] DOB: [birth year] DOA: 11/07/2017 PCP: Jolene Provost, MD    Brief Narrative:  62 year old male who was admitted with diabetes ketoacidosis.  Transferred from [facility] September 19, patient had nausea, diarrhea, dyspnea and cough.  Apparently he developed acute hypoxic respiratory failure after volume resuscitation.  Patient eventually required invasive mechanical ventilation.  His chest x-ray show a loculated right pleural effusion.  His troponins were elevated.  He was successfully liberated from mechanical ventilation and transferred to the hospital medicine service September 23.   On his admission physical examination on noninvasive mechanical ventilation his blood pressure was 152/81, heart rate 109, temperature 97.5, respiratory rate 38, oxygen saturation 99% on 65% FiO2.  He had coarse breath sounds bilaterally, positive accessory muscle use, heart S1-S2 present, tachycardic, abdomen soft nontender, no lower extremity edema.  Sodium 136, potassium 3.9, chloride 95, bicarb 15, glucose 302, BUN 46, creatinine 1.41, anion gap 26, troponin I 0.59 lipase 50, white cell count 13.7, hemoglobin 15.3, platelets 436, urinalysis negative for infection.  Urine drug screen negative.  Chest radiograph with a large loculated right pleural effusion.  EKG with sinus rhythm, left axis deviation, ST depressions V4, V5 and V6.  Patient was admitted to the hospital with working diagnosis of diabetes ketoacidosis, complicated by non-ST elevation myocardial infarction.  Assessment & Plan:   Active Problems:   DKA (diabetic ketoacidoses) (HCC)   Acute respiratory failure (HCC)   Acute coronary syndrome (HCC)   1.  T2DM with resolved Diabetes ketoacidosis. Capillary glucose 271, 228, 234, 71, 290, for now will continue insulin sliding scale and basal insulin with levimir 30 units. Patient tolerating po well. At home patient on metformin, glipizide and  empagliflozin, plus insulin glargine 80 units.   2.  Demand ischemia with elevated troponin/myocardial infarction type II.  3 vessel obstructive CAD. Patent stent in the LCx and patent LIMA to LAD.  No angina type chest pain, telemetry has been discontinued continue guideline directed medical therapy with asa, clopidogrel, metoprolol and rosuvastatin.   3.  Large loculated right pleural effusion. Chest CT with moderate large loculated pleural effusion on the right, will order a diagnostic thoracentesis, send fluid for cell count, gram stain, culture, ldh, protein, cytology and afb. 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 Oxygen saturation 95% on room air.   4.  Acute kidney injury. Patient tolerating po well, will continue to avoid hypotension and nephrotoxic medications.   5.  Dyslipidemia. On statin with good toleration.   6. HTN. Blood pressure has remained stable, continue holding losartan, metoprolol and hctz.   DVT prophylaxis: enoxaparin   Code Status: full Family Communication: No family at the bedside  Disposition Plan/ discharge barriers: Pending pulmonary thoracentesis.    Consultants:   Cardiology   Procedures:     Antimicrobials:      Subjective: Patient continue to have dyspnea and pleuritic chest pain, no nausea or vomiting.   Objective: Vitals:   11/13/17 1517 11/13/17 2013 11/14/17 0542 11/14/17 1045  BP: (!) 126/56 (!) 164/62 (!) 123/50 (!) 148/66  Pulse: 64 70 61 77  Resp: 14  17   Temp: 97.9 F (36.6 C) 98 F (36.7 C) 98.6 F (37 C)   TempSrc:  Oral    SpO2: 96% 94% 95%   Weight:      Height:        Intake/Output Summary (Last 24 hours) at 11/14/2017 1303 Last data filed at 11/14/2017 1234  Gross per 24 hour  Intake 800 ml  Output -  Net 800 ml   Filed Weights   11/11/17 0507 11/12/17 0300 11/13/17 0548  Weight: 82.6 kg 81.2 kg 80.6 kg    Examination:   General: deconditioned  Neurology: Awake and alert, non focal  E  ENT: mild pallor, no icterus, oral mucosa moist Cardiovascular: No JVD. S1-S2 present, rhythmic, no gallops, rubs, or murmurs. No lower extremity edema. Pulmonary: decreased breath sounds on the left side, no wheezing, rhonchi or rales. Gastrointestinal. Abdomen with no organomegaly, non tender, no rebound or guarding Skin. No rashes Musculoskeletal: no joint deformities     Data Reviewed: I have personally reviewed following labs and imaging studies  CBC: Recent Labs  Lab 11/10/17 0303 11/10/17 0845 11/11/17 0450 11/12/17 0459 11/13/17 0211 11/14/17 0530  WBC 12.3* DUPLICATE  REFER X10552 11.9* 10.7* 11.8* 11.1*  NEUTROABS 8.7* PENDING  --   --   --   --   HGB 10.3* DUPLICATE  REFER X10552 10.8* 11.2* 10.6* 10.6*  HCT 31.4* DUPLICATE  REFER X10552 32.4* 34.3* 32.2* 32.2*  MCV 92.1 DUPLICATE  REFER X10552 92.0 91.2 91.2 92.0  PLT 350 DUPLICATE  REFER X10552 381 403* 455* 445*   Basic Metabolic Panel: Recent Labs  Lab 11/08/17 0753 11/08/17 1019 11/08/17 1618 11/09/17 0500 11/10/17 0303 11/11/17 0450 11/12/17 0459 11/13/17 0211  NA 136  --   --   --  138 137 131* 132*  K 3.0*  --   --   --  3.4* 3.3* 4.1 4.5  CL 107  --   --   --  107 104 95* 97*  CO2 19*  --   --   --  21* 25 25 26   GLUCOSE 181*  --   --   --  111* 45* 240* 239*  BUN 43*  --   --   --  28* 17 16 15   CREATININE 1.17  --   --   --  0.85 0.73 0.85 0.98  CALCIUM 7.9*  --   --   --  7.5* 7.8* 7.7* 7.9*  MG  --  2.6* 2.4 2.5*  --   --  1.6* 1.8  PHOS  --  1.3* 1.1* 3.0  --   --   --   --    GFR: Estimated Creatinine Clearance: 81.7 mL/min (by C-G formula based on SCr of 0.98 mg/dL). Liver Function Tests: Recent Labs  Lab 11/08/17 0753  AST 21  ALT 14  ALKPHOS 59  BILITOT 0.7  PROT 5.6*  ALBUMIN 2.2*   No results for input(s): LIPASE, AMYLASE in the last 168 hours. No results for input(s): AMMONIA in the last 168 hours. Coagulation Profile: No results for input(s): INR, PROTIME in the last  168 hours. Cardiac Enzymes: Recent Labs  Lab 11/07/17 1353 11/07/17 1744 11/08/17 0004 11/08/17 0753  TROPONINI 0.59* 1.77* 2.00* 1.42*   BNP (last 3 results) No results for input(s): PROBNP in the last 8760 hours. HbA1C: No results for input(s): HGBA1C in the last 72 hours. CBG: Recent Labs  Lab 11/13/17 1210 11/13/17 1703 11/13/17 2236 11/14/17 0812 11/14/17 1224  GLUCAP 272* 228* 234* 71 290*   Lipid Profile: No results for input(s): CHOL, HDL, LDLCALC, TRIG, CHOLHDL, LDLDIRECT in the last 72 hours. Thyroid Function Tests: No results for input(s): TSH, T4TOTAL, FREET4, T3FREE, THYROIDAB in the last 72 hours. Anemia Panel: No results for input(s): VITAMINB12, FOLATE, FERRITIN, TIBC, IRON, RETICCTPCT in the  last 72 hours.    Radiology Studies: I have reviewed all of the imaging during this hospital visit personally     Scheduled Meds: . aspirin  81 mg Per Tube Daily  . benzonatate  200 mg Oral TID  . clopidogrel  75 mg Oral Daily  . enoxaparin (LOVENOX) injection  40 mg Subcutaneous Q24H  . feeding supplement (GLUCERNA SHAKE)  237 mL Oral TID BM  . insulin aspart  0-15 Units Subcutaneous TID WC  . insulin aspart  0-5 Units Subcutaneous QHS  . insulin detemir  30 Units Subcutaneous Q12H  . metoprolol succinate  25 mg Oral Daily  . rosuvastatin  20 mg Oral q1800  . sodium chloride flush  3 mL Intravenous Q12H   Continuous Infusions: . sodium chloride       LOS: 7 days        Weda Baumgarner Annett Gula, MD [facility] Pager [phone]

## 2017-11-15 ENCOUNTER — Inpatient Hospital Stay (HOSPITAL_COMMUNITY): Payer: BLUE CROSS/BLUE SHIELD

## 2017-11-15 ENCOUNTER — Encounter (HOSPITAL_COMMUNITY): Payer: Self-pay | Admitting: Student

## 2017-11-15 DIAGNOSIS — I10 Essential (primary) hypertension: Secondary | ICD-10-CM

## 2017-11-15 HISTORY — PX: IR THORACENTESIS ASP PLEURAL SPACE W/IMG GUIDE: IMG5380

## 2017-11-15 LAB — LACTATE DEHYDROGENASE, PLEURAL OR PERITONEAL FLUID: LD, Fluid: 408 U/L — ABNORMAL HIGH (ref 3–23)

## 2017-11-15 LAB — COMPREHENSIVE METABOLIC PANEL
ALBUMIN: 2.3 g/dL — AB (ref 3.5–5.0)
ALT: 41 U/L (ref 0–44)
AST: 42 U/L — AB (ref 15–41)
Alkaline Phosphatase: 66 U/L (ref 38–126)
Anion gap: 8 (ref 5–15)
BUN: 11 mg/dL (ref 8–23)
CHLORIDE: 99 mmol/L (ref 98–111)
CO2: 26 mmol/L (ref 22–32)
CREATININE: 0.74 mg/dL (ref 0.61–1.24)
Calcium: 8.4 mg/dL — ABNORMAL LOW (ref 8.9–10.3)
GFR calc Af Amer: >60 mL/min (ref >60)
GLUCOSE: 223 mg/dL — AB (ref 70–99)
Potassium: 4.2 mmol/L (ref 3.5–5.1)
SODIUM: 133 mmol/L — AB (ref 135–145)
Total Bilirubin: 0.4 mg/dL (ref 0.3–1.2)
Total Protein: 5.9 g/dL — ABNORMAL LOW (ref 6.5–8.1)

## 2017-11-15 LAB — BODY FLUID CELL COUNT WITH DIFFERENTIAL
Lymphs, Fluid: 55 %
Monocyte-Macrophage-Serous Fluid: 25 % — ABNORMAL LOW (ref 50–90)
NEUTROPHIL FLUID: 20 % (ref 0–25)
WBC FLUID: 212 uL (ref 0–1000)

## 2017-11-15 LAB — PROTEIN, PLEURAL OR PERITONEAL FLUID: TOTAL PROTEIN, FLUID: 4.1 g/dL

## 2017-11-15 LAB — GLUCOSE, CAPILLARY
GLUCOSE-CAPILLARY: 181 mg/dL — AB (ref 70–99)
GLUCOSE-CAPILLARY: 218 mg/dL — AB (ref 70–99)
Glucose-Capillary: 196 mg/dL — ABNORMAL HIGH (ref 70–99)
Glucose-Capillary: 351 mg/dL — ABNORMAL HIGH (ref 70–99)

## 2017-11-15 LAB — GRAM STAIN: Gram Stain: NONE SEEN

## 2017-11-15 LAB — LACTATE DEHYDROGENASE: LDH: 144 U/L (ref 98–192)

## 2017-11-15 LAB — GLUCOSE, PLEURAL OR PERITONEAL FLUID: Glucose, Fluid: 207 mg/dL

## 2017-11-15 MED ORDER — LIDOCAINE HCL (PF) 2 % IJ SOLN
INTRAMUSCULAR | Status: DC | PRN
  Administered 2017-11-15: 10 mL

## 2017-11-15 MED ORDER — METFORMIN HCL 500 MG PO TABS
1000.0000 mg | ORAL_TABLET | Freq: Two times a day (BID) | ORAL | Status: DC
  Administered 2017-11-15: 1000 mg via ORAL
  Filled 2017-11-15: qty 2

## 2017-11-15 MED ORDER — LIDOCAINE HCL (PF) 2 % IJ SOLN
INTRAMUSCULAR | Status: AC
  Administered 2017-11-15: 17:00:00
  Filled 2017-11-15: qty 20

## 2017-11-15 MED ORDER — INSULIN DETEMIR 100 UNIT/ML ~~LOC~~ SOLN
40.0000 [IU] | Freq: Two times a day (BID) | SUBCUTANEOUS | Status: DC
  Administered 2017-11-15: 40 [IU] via SUBCUTANEOUS
  Filled 2017-11-15 (×2): qty 0.4

## 2017-11-15 NOTE — Progress Notes (Signed)
PROGRESS NOTE    Brian Miller  ZOX:[id number] DOB: 06-27-1955 DOA: 11/07/2017 PCP: Jolene Provost, MD    Brief Narrative:  62 year old male who was admitted with diabetes ketoacidosis. Transferred from [facility] September 19, patient had nausea, diarrhea, dyspnea and cough. Apparently he developed acute hypoxic respiratory failure after volume resuscitation.Patient eventually required invasive mechanical ventilation.His chest x-ray show a loculated right pleural effusion. His troponins were elevated.He was successfully liberated from mechanical ventilation and transferred to the hospital medicine service September 23.  On his admission physical examination on noninvasive mechanical ventilation his blood pressure was 152/81, heart rate 109, temperature 97.5, respiratory rate 38, oxygen saturation 99% on 65% FiO2.  He had coarse breath sounds bilaterally, positive accessory muscle use, heart S1-S2 present, tachycardic, abdomen soft nontender, no lower extremity edema.  Sodium 136, potassium 3.9, chloride 95, bicarb 15, glucose 302, BUN 46, creatinine 1.41, anion gap 26, troponin I 0.59 lipase 50, white cell count 13.7, hemoglobin 15.3, platelets 436, urinalysis negative for infection.  Urine drug screen negative.  Chest radiograph with a large loculated right pleural effusion.  EKG with sinus rhythm, left axis deviation, ST depressions V4, V5 and V6.  Patient was admitted to the hospital with working diagnosis of diabetes ketoacidosis, complicated by non-ST elevation myocardial infarction.    Assessment & Plan:   Active Problems:   DKA (diabetic ketoacidoses) (HCC)   Acute respiratory failure (HCC)   Acute coronary syndrome (HCC)   1. T2DM with resolved Diabetes ketoacidosis. Patient is tolerating po well, capillary glucose 290, 161, 221, 196, 351. Will increase his basal insulin (levimir) to 40 units bid and will resume metformin. Home regimen with glipizide and  empagliflozin, plus insulin glargine 80 units per day.   2.Demand ischemia with elevated troponin/myocardial infarction type II.  3 vessel obstructive CAD. Patent stent in the LCx and patent LIMA to LAD.  Continue aggressive medical therapy with asa, clopidogrel, metoprolol and rosuvastatin.   3.Large loculated right pleural effusion. 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 Pending right thoracentesis as part of the work up. Continue oxygen saturation at 93 to 95% on room air.   4.Acute kidney injury. Resolved, renal function today with serum cr at 0.74.   5.Dyslipidemia. Continue statin.  6. HTN. Systolic blood pressure 120 to 140, continue close monitoring. Home regimen with losartan, metoprolol and hctz.   DVT prophylaxis:enoxaparin Code Status:full Family Communication:I spoke with patient's wife at the bedside and all questions were addressed.   Disposition Plan/ discharge barriers:Pending pulmonary thoracentesis.   Consultants:  Cardiology  Procedures:    Antimicrobials:  Subjective: Patient continue to have pleuritic chest pain, no nausea or vomiting, dyspnea has improved but not back to baseline.   Objective: Vitals:   11/14/17 1043 11/14/17 1045 11/14/17 2106 11/15/17 0513  BP: (!) 148/66 (!) 148/66 (!) 140/54 (!) 120/57  Pulse: 77 77 68 (!) 54  Resp:   17 16  Temp:   97.8 F (36.6 C) 98.8 F (37.1 C)  TempSrc:      SpO2:   93% 95%  Weight:      Height:        Intake/Output Summary (Last 24 hours) at 11/15/2017 1129 Last data filed at 11/14/2017 1234 Gross per 24 hour  Intake 240 ml  Output -  Net 240 ml   Filed Weights   11/11/17 0507 11/12/17 0300 11/13/17 0548  Weight: 82.6 kg 81.2 kg 80.6 kg    Examination:   General: deconditioned  Neurology:  Awake and alert, non focal  E ENT: mild pallor, no icterus, oral mucosa moist Cardiovascular: No JVD. S1-S2 present, rhythmic, no gallops, rubs, or murmurs. No lower  extremity edema. Pulmonary: decreased breath sounds at the right side, no wheezing, rhonchi or rales. Gastrointestinal. Abdomen protuberant no organomegaly, non tender, no rebound or guarding Skin. No rashes Musculoskeletal: no joint deformities     Data Reviewed: I have personally reviewed following labs and imaging studies  CBC: Recent Labs  Lab 11/10/17 0303 11/10/17 0845 11/11/17 0450 11/12/17 0459 11/13/17 0211 11/14/17 0530  WBC 12.3* DUPLICATE  REFER [id number] 11.9* 10.7* 11.8* 11.1*  NEUTROABS 8.7* PENDING  --   --   --   --   HGB 10.3* DUPLICATE  REFER [id number] 10.8* 11.2* 10.6* 10.6*  HCT 31.4* DUPLICATE  REFER X10552 32.4* 34.3* 32.2* 32.2*  MCV 92.1 DUPLICATE  REFER X10552 92.0 91.2 91.2 92.0  PLT 350 DUPLICATE  REFER [id number] 381 403* 455* 445*   Basic Metabolic Panel: Recent Labs  Lab 11/08/17 1618 11/09/17 0500 11/10/17 0303 11/11/17 0450 11/12/17 0459 11/13/17 0211 11/15/17 0452  NA  --   --  138 137 131* 132* 133*  K  --   --  3.4* 3.3* 4.1 4.5 4.2  CL  --   --  107 104 95* 97* 99  CO2  --   --  21* 25 25 26 26   GLUCOSE  --   --  111* 45* 240* 239* 223*  BUN  --   --  28* 17 16 15 11   CREATININE  --   --  0.85 0.73 0.85 0.98 0.74  CALCIUM  --   --  7.5* 7.8* 7.7* 7.9* 8.4*  MG 2.4 2.5*  --   --  1.6* 1.8  --   PHOS 1.1* 3.0  --   --   --   --   --    GFR: Estimated Creatinine Clearance: 100.1 mL/min (by C-G formula based on SCr of 0.74 mg/dL). Liver Function Tests: Recent Labs  Lab 11/15/17 0452  AST 42*  ALT 41  ALKPHOS 66  BILITOT 0.4  PROT 5.9*  ALBUMIN 2.3*   No results for input(s): LIPASE, AMYLASE in the last 168 hours. No results for input(s): AMMONIA in the last 168 hours. Coagulation Profile: No results for input(s): INR, PROTIME in the last 168 hours. Cardiac Enzymes: No results for input(s): CKTOTAL, CKMB, CKMBINDEX, TROPONINI in the last 168 hours. BNP (last 3 results) No results for input(s): PROBNP in the last 8760  hours. HbA1C: No results for input(s): HGBA1C in the last 72 hours. CBG: Recent Labs  Lab 11/14/17 0812 11/14/17 1224 11/14/17 1719 11/14/17 2108 11/15/17 0804  GLUCAP 71 290* 161* 221* 196*   Lipid Profile: No results for input(s): CHOL, HDL, LDLCALC, TRIG, CHOLHDL, LDLDIRECT in the last 72 hours. Thyroid Function Tests: No results for input(s): TSH, T4TOTAL, FREET4, T3FREE, THYROIDAB in the last 72 hours. Anemia Panel: No results for input(s): VITAMINB12, FOLATE, FERRITIN, TIBC, IRON, RETICCTPCT in the last 72 hours.    Radiology Studies: I have reviewed all of the imaging during this hospital visit personally     Scheduled Meds: . aspirin  81 mg Per Tube Daily  . benzonatate  200 mg Oral TID  . clopidogrel  75 mg Oral Daily  . feeding supplement (GLUCERNA SHAKE)  237 mL Oral TID BM  . insulin aspart  0-15 Units Subcutaneous TID WC  . insulin aspart  0-5 Units Subcutaneous  QHS  . insulin detemir  30 Units Subcutaneous Q12H  . metoprolol succinate  25 mg Oral Daily  . rosuvastatin  20 mg Oral q1800  . sodium chloride flush  3 mL Intravenous Q12H   Continuous Infusions: . sodium chloride       LOS: 8 days        Brando Taves Annett Gula, MD [facility] Pager [phone]

## 2017-11-15 NOTE — Procedures (Signed)
PROCEDURE SUMMARY:  Successful US guided diagnostic and therapeutic right thoracentesis. Yielded 300 mL of amber fluid. Pt tolerated procedure well, however stopped prior to removal of all fluid due to chest discomfort.  No immediate complications.  Specimen was sent for labs. CXR ordered.  Hoyt Koch PA-C 11/15/2017 1:24 PM

## 2017-11-15 NOTE — Evaluation (Signed)
Physical Therapy Evaluation & Discharge Patient Details Name: Brian Miller MRN: [id number] DOB: 1955/08/03 Today's Date: 11/15/2017   History of Present Illness  Pt is a 62 y.o. admitted 11/07/17 with DKA. Developed acute hypoxic respiratory failure, requiring ETT 9/19-9/21. Also with NSTEMI and AKI. S/p cath 9/24. Chest CT with large R pleural effusion; plan for thoracentesis. PMH includes DMII, diabetic neuropathy, HTN, CHF, CAD.    Clinical Impression  Patient evaluated by Physical Therapy with no further acute PT needs identified. PTA, pt indep, lives with supportive family, and works active job. Today, pt indep with transfers, ambulation, and ADLs; limited by decreased activity tolerance. SpO2 >/90% on RA throughout. Educ on energy conservation and importance of continued mobility. All education has been completed and the patient has no further questions. Encouraged to continue ambulating during hospital admission (RN notified). PT is signing off. Thank you for this referral.    Follow Up Recommendations No PT follow up    Equipment Recommendations  None recommended by PT    Recommendations for Other Services       Precautions / Restrictions Precautions Precautions: None      Mobility  Bed Mobility Overal bed mobility: Independent                Transfers Overall transfer level: Independent                  Ambulation/Gait Ambulation/Gait assistance: Independent   Assistive device: None Gait Pattern/deviations: WFL(Within Functional Limits) Gait velocity: Decreased Gait velocity interpretation: 1.31 - 2.62 ft/sec, indicative of limited community ambulator General Gait Details: SpO2 >/90% with ambulation on RA; DOE 2/4, but pt able to carry minimal conversation  Information systems manager [facility] (Stroke Patients Only)       Balance Overall balance assessment: No apparent balance deficits (not formally assessed)                                           Pertinent Vitals/Pain Pain Assessment: No/denies pain    Home Living Family/patient expects to be discharged to:: Private residence Living Arrangements: Spouse/significant other;Children Available Help at Discharge: Family;Friend(s);Available 24 hours/day Type of Home: House Home Access: Stairs to enter Entrance Stairs-Rails: [facility] [place] of Steps: 7 Home Layout: Two level;Laundry or work area in [facility] [place] Equipment: Environmental consultant - 2 wheels;Cane - single point      Prior Function Level of Independence: Independent         Comments: Works at [employer], heavy lifting on feet majority of day     Hand Dominance        Extremity/Trunk Assessment   Upper Extremity Assessment Upper Extremity Assessment: Overall WFL for tasks assessed    Lower Extremity Assessment Lower Extremity Assessment: Overall WFL for tasks assessed    Cervical / Trunk Assessment Cervical / Trunk Assessment: Normal  Communication   Communication: No difficulties  Cognition Arousal/Alertness: Awake/alert Behavior During Therapy: WFL for tasks assessed/performed Overall Cognitive Status: Within Functional Limits for tasks assessed                                        General Comments General comments (skin integrity, edema, etc.): Sister present  Exercises     Assessment/Plan    PT Assessment Patent does not need any further PT services  PT Problem List         PT Treatment Interventions      PT Goals (Current goals can be found in the Care Plan section)  Acute Rehab PT Goals PT Goal Formulation: All assessment and education complete, DC therapy    Frequency     Barriers to discharge        Co-evaluation               AM-PAC PT "6 Clicks" Daily Activity  Outcome Measure Difficulty turning over in bed (including adjusting bedclothes, sheets and blankets)?: None Difficulty  moving from lying on back to sitting on the side of the bed? : None Difficulty sitting down on and standing up from a chair with arms (e.g., wheelchair, bedside commode, etc,.)?: None Help needed moving to and from a bed to chair (including a wheelchair)?: None Help needed walking in hospital room?: None Help needed climbing 3-5 steps with a railing? : None 6 Click Score: 24    End of Session Equipment Utilized During Treatment: Gait belt Activity Tolerance: Patient tolerated treatment well Patient left: in bed;with call bell/phone within reach Nurse Communication: Mobility status PT Visit Diagnosis: Other abnormalities of gait and mobility (R26.89)    Time: 6378-5885 PT Time Calculation (min) (ACUTE ONLY): 14 min   Charges:   PT Evaluation $PT Eval Moderate Complexity: 1 Mod         Ina Homes, PT, DPT Acute Rehabilitation Services  Pager [phone] Office [phone]  Malachy Chamber 11/15/2017, 11:43 AM

## 2017-11-16 LAB — ACID FAST SMEAR (AFB, MYCOBACTERIA): Acid Fast Smear: NEGATIVE

## 2017-11-16 LAB — GLUCOSE, CAPILLARY
GLUCOSE-CAPILLARY: 62 mg/dL — AB (ref 70–99)
Glucose-Capillary: 187 mg/dL — ABNORMAL HIGH (ref 70–99)

## 2017-11-16 LAB — ACID FAST SMEAR (AFB)

## 2017-11-16 MED ORDER — CLOPIDOGREL BISULFATE 75 MG PO TABS: 75.0000 mg | ORAL_TABLET | Freq: Every day | ORAL | 0 refills | Status: AC

## 2017-11-16 MED ORDER — GUAIFENESIN-DM 100-10 MG/5ML PO SYRP
5.0000 mL | ORAL_SOLUTION | Freq: Four times a day (QID) | ORAL | Status: DC | PRN
  Administered 2017-11-16: 5 mL via ORAL
  Filled 2017-11-16: qty 5

## 2017-11-16 MED ORDER — GUAIFENESIN-DM 100-10 MG/5ML PO SYRP: 5.0000 mL | ORAL_SOLUTION | Freq: Four times a day (QID) | ORAL | 0 refills | Status: AC | PRN

## 2017-11-16 NOTE — Discharge Summary (Signed)
Physician Discharge Summary  Brian Miller UQJ:[id number] DOB: 14-Aug-1955 DOA: 11/07/2017  PCP: Jolene Provost, MD  Admit date: 11/07/2017 Discharge date: 11/16/2017  Admitted From: Home  Disposition:  Home   Recommendations for Outpatient Follow-up and new medication changes:  1. Follow up with Dr. William Hamburger in 7 days.  2. Pending pleural fluid cytology, AFB and culture  3. Patient has been placed on dual antiplatelet therapy with asa and clopidogrel for coronary artery disease.   Home Health: no   Equipment/Devices: no    Discharge Condition: stable  CODE STATUS: full  Diet recommendation: Heart healthy and diabetic prudent  Brief/Interim Summary: 62 year old male who was admitted with diabetes ketoacidosis. Transferred from [name] September 19, patient had nausea, diarrhea, dyspnea and cough. Apparently he developed acute hypoxic respiratory failure after volume resuscitation.Patient eventually required invasive mechanical ventilation.His chest x-ray show a loculated right pleural effusion. His troponins were elevated.He was successfully liberated from mechanical ventilation and transferred to the hospital medicine service September 23.On his admission physical examination on noninvasive mechanical ventilation his blood pressure was152/81, heart rate 109, temperature 97.5, respiratory rate 38, oxygen saturation 99% on 65% FiO2. He had coarse breath sounds bilaterally, positive accessory muscle use, heart S1-S2 present, tachycardic, abdomen soft nontender, no lower extremity edema. Sodium 136, potassium 3.9, chloride 95, bicarb 15, glucose 302, BUN 46, creatinine 1.41, anion gap 26, troponin I0.59, lipase 50, white cell count 13.7, hemoglobin 15.3, platelets 436, urinalysis negative for infection. Urine drug screen negative. Chest radiograph with a large loculated right pleural effusion.EKG with sinus rhythm, left axis deviation, ST depressions V4, V5 and  V6.  Patient was admitted to the hospital with working diagnosis of diabetes ketoacidosis, complicated by non-ST elevation myocardial infarction and acute hypoxic respiratory failure.   1.  Type 2 diabetes mellitus complicated with diabetes ketoacidosis.  Patient was admitted to the intensive care unit, he received insulin infusion intravenously, his electrolytes were corrected, she was successfully transitioned to subcutaneous insulin.  At home he will resume his regimen of 80 units of glargine, glipizide, metformin and empagliflozin.  His fasting discharge glucose 223 on insulin and metformin therapy.  2.  Type II myocardial infarction, demand ischemia. In the setting of 3 vessels coronary artery disease.  Patient was initially treated with IV heparin, his peak troponin was 2.0.  His echocardiogram showed left ventricle ejection fraction 55 to 60%, no mention of wall motion abnormalities, positive left ventricular hypertrophy.  Further work-up with cardiac catheterization showed three-vessel obstructive CAD, 100% ostial LAD, stent to left circumflex widely patent, 70% RCA at the crux, patent LIMA to LAD, known occluded SVG to OM.  Patient was placed on dual antiplatelet therapy (aspiring and clopidogrel), continue aggressive guideline directed medical therapy, with metoprolol, losartan and rosuvastatin.  3.  Acute hypoxic respiratory failure due to volume overload and moderate loculated right exudate pleural effusion.  Patient required invasive mechanical ventilation, he was successfully liberated from mechanical ventilation and transitioned to nasal cannula, at discharge his oxygenation is 96% on room air.  He underwent a ultrasound-guided thoracentesis, exudative per light's criteria.  White cell count to 212, with 55 lymphocytes, 20 neutrophils, 25 monocytes.  Cytology is pending.  I will recommend follow-up imaging imaging in 2 weeks.  4.  Prerenal acute kidney injury.  Patient received supportive  medical therapy, peak creatinine 1.59, discharge serum creatinine 0.74, sodium 133, potassium 4.2, serum bicarbonate 26.  5.  Dyslipidemia.  Continue rosuvastatin.  6.  Hypertension.  Patient will  resume his antihypertensive regimen with losartan, metoprolol and hydrochlorothiazide.    Discharge Diagnoses:  Active Problems:   DKA (diabetic ketoacidoses) (HCC)   Acute respiratory failure (HCC)   Acute coronary syndrome [facility])    Discharge Instructions   Allergies as of 11/16/2017   No Known Allergies     Medication List    TAKE these medications   aspirin 81 MG chewable tablet Chew 81 mg by mouth daily. What changed:  Another medication with the same name was removed. Continue taking this medication, and follow the directions you see here.   clopidogrel 75 MG tablet Commonly known as:  PLAVIX Take 1 tablet (75 mg total) by mouth daily.   empagliflozin 25 MG Tabs tablet Commonly known as:  JARDIANCE Take 25 mg by mouth daily.   glimepiride 4 MG tablet Commonly known as:  AMARYL Take 2 mg by mouth 2 (two) times daily.   guaiFENesin-dextromethorphan 100-10 MG/5ML syrup Commonly known as:  ROBITUSSIN DM Take 5 mLs by mouth every 6 (six) hours as needed for cough.   insulin glargine 100 UNIT/ML injection Commonly known as:  LANTUS Inject 80 Units into the skin daily.   losartan-hydrochlorothiazide 100-25 MG tablet Commonly known as:  HYZAAR Take 1 tablet by mouth daily.   metFORMIN 1000 MG tablet Commonly known as:  GLUCOPHAGE Take 1,000 mg by mouth 2 (two) times daily with a meal.   metoprolol succinate 25 MG 24 hr tablet Commonly known as:  TOPROL-XL Take 25 mg by mouth daily.   rosuvastatin 20 MG tablet Commonly known as:  CRESTOR Take 20 mg by mouth daily.       No Known Allergies  Consultations:  Cardiology    Procedures/Studies: Dg Chest 1 View  Result Date: 11/15/2017 CLINICAL DATA:  Status post thoracentesis. EXAM: CHEST  1 VIEW  COMPARISON:  Chest CT, 11/13/2017.  Chest radiographs, 11/11/2017. FINDINGS: There is decreased pleural fluid on the right following thoracentesis. No pneumothorax. Mild hazy opacity is noted in the right upper lobe adjacent to the minor fissure. There is high basilar lung opacity consistent with small residual effusions with atelectasis. Right upper lobe opacity is likely a small amount of fluid in the minor fissure with atelectasis. Remainder of the lungs is clear.  No pulmonary edema. 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 IMPRESSION: 1. Significant decrease in right pleural fluid following thoracentesis. 2. No pneumothorax. Electronically Signed   By: Amie Portland M.D.   On: 11/15/2017 13:33   Dg Chest 2 View  Result Date: 11/11/2017 CLINICAL DATA:  Severe dyspnea.  Prior cardiac surgery. EXAM: CHEST - 2 VIEW COMPARISON:  11/11/2017 CXR, chest CT 05/26/2013 FINDINGS: Interval increase in loculated right-sided pleural effusion greatest along the posterior aspect of the right hemithorax as suggested on the lateral view and along the right upper thorax laterally. Small left effusion. Cardiomegaly stable. Nonaneurysmal appearing thoracic aorta. The patient is status post median sternotomy and CABG. Calcified pleural plaque at the left lung base. There is some bibasilar streaky atelectatic change. Degenerative changes are present about the thoracic spine. IMPRESSION: 1. Moderate to large loculated pleural effusion along the posterior and right upper lateral aspect of the thorax. Fluid is also seen extending into the minor fissure. 2. 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 3. Stable cardiomegaly. Electronically Signed   By: Tollie Eth M.D.   On: 11/11/2017 17:34   Ct Chest Wo Contrast  Result Date: 11/13/2017 CLINICAL DATA:  Chest pain, shortness of breath, pleural  effusion, leukocytosis, history CHF, type II diabetes mellitus, coronary artery disease,  hypertension EXAM: CT CHEST WITHOUT CONTRAST TECHNIQUE: Multidetector CT imaging of the chest was performed following the standard protocol without IV contrast. Sagittal and coronal MPR images reconstructed from axial data set. COMPARISON:  05/26/2013 FINDINGS: Cardiovascular: Atherosclerotic calcifications of aorta, coronary arteries and proximal great vessels. Postsurgical changes of CABG. Aorta normal caliber. No pericardial effusion. Mediastinum/Nodes: Esophagus unremarkable. Scattered normal size mediastinal lymph nodes without thoracic adenopathy. Base of cervical region normal appearance. Lungs/Pleura: LEFT pleural and diaphragmatic calcifications. Partially loculated moderate-sized RIGHT pleural effusion. Subsegmental atelectasis LEFT lung base. Significant atelectasis of RIGHT lower lobe, subtotal, with additional subsegmental atelectasis in RIGHT middle and RIGHT upper lobes. Cannot exclude consolidation in RIGHT lower lobe. No pneumothorax. Few tiny calcified granulomata within both lungs. Upper Abdomen: Visualized upper abdomen unremarkable. Musculoskeletal: No acute osseous findings. IMPRESSION: Partially loculated moderate-sized RIGHT pleural effusion. Pleural calcifications in the inferior RIGHT hemithorax with additional calcifications at the central diaphragm; these could be related to prior CABG, hemothorax or empyema, other etiologies such as TB or asbestos exposure less likely. Scattered atelectasis throughout both lungs greatest in RIGHT lower lobe, cannot exclude coexistent RIGHT lower lobe pneumonia. Scattered atherosclerotic calcifications including aorta and coronary arteries post CABG. Aortic Atherosclerosis (ICD10-I70.0). Electronically Signed   By: Ulyses Southward M.D.   On: 11/13/2017 14:14   Dg Chest Port 1 View  Result Date: 11/11/2017 CLINICAL DATA:  62 year old male with shortness of breath and nonproductive cough. DKA, N-STEMI. EXAM: PORTABLE CHEST 1 VIEW COMPARISON:  11/09/2017 and  earlier. FINDINGS: Portable AP upright view at 0930 hours. Extubated and enteric tube removed. Left subclavian central line removed. Continued right greater than left veiling lung opacity compatible with pleural effusion which is probably at least partially loculated on the right. Lower lung volumes since 11/07/2017. Stable mediastinal contour. No consolidation or pneumothorax. Negative visible bowel gas pattern. Prior sternotomy. IMPRESSION: 1. Extubated, left subclavian line, and enteric tube removed. 2. Lower lung volumes with persistent bilateral pleural effusions, suspected to be partially loculated on the right. Upright PA and lateral views would be helpful when possible. Electronically Signed   By: Odessa Fleming M.D.   On: 11/11/2017 09:40   Dg Chest Port 1 View  Result Date: 11/09/2017 CLINICAL DATA:  Respiratory failure. EXAM: PORTABLE CHEST 1 VIEW COMPARISON:  Chest x-rays dated 11/08/2017, 11/07/2017 and 02/07/2015 FINDINGS: Endotracheal tube tip is approximately 3.5 cm above the carina. Central line tip is at the cavoatrial junction. NG tube tip is below the diaphragm. Large loculated right pleural effusion persists, slightly increased. Pulmonary vascularity is now normal with no residual pulmonary edema. Slight atelectasis at the left base has almost completely resolved. Chronic lateral pleural thickening on the left. CABG.  No acute bone abnormality. IMPRESSION: 1. Resolution of pulmonary vascular congestion. 2. Slightly increased large loculated right pleural effusion. 3. Improved minimal atelectasis at the left lung base. Electronically Signed   By: Francene Boyers M.D.   On: 11/09/2017 07:31   Dg Chest Port 1 View  Result Date: 11/08/2017 CLINICAL DATA:  Respiratory failure EXAM: PORTABLE CHEST 1 VIEW COMPARISON:  11/07/2017 FINDINGS: Cardiac shadow is enlarged but stable. Left subclavian central line is noted in satisfactory position. Endotracheal tube and nasogastric catheter are again seen in  satisfactory position. Right-sided pleural effusion is again noted but appears slightly improved although this may be positional in nature. Small left effusion is noted as well. No focal confluent infiltrate is seen. IMPRESSION: Bilateral pleural  effusions right greater than left. No focal infiltrate is seen. Tubes and lines as described. Electronically Signed   By: Alcide Clever M.D.   On: 11/08/2017 08:40   Dg Chest Port 1 View  Result Date: 11/07/2017 CLINICAL DATA:  Intubated EXAM: PORTABLE CHEST 1 VIEW COMPARISON:  None. FINDINGS: Endotracheal tube tip is 2.6 cm above the carina. Enteric tube terminates in the gastric fundus. Left subclavian central venous catheter terminates at the cavoatrial junction. Intact sternotomy wires. Low lung volumes. Top-normal heart size. Otherwise normal mediastinal contour. No pneumothorax. Bilateral pleural thickening/effusions, most prominent in the upper right pleural space and along the right fissures. Hazy parahilar and bibasilar opacities in both lungs. IMPRESSION: 1. Well-positioned support structures.  No pneumothorax. 2. Bilateral pleural thickening/effusions, most prominent in the upper right pleural space and along the right fissures. 3. Hazy parahilar and bibasilar opacities in both lungs, which could represent atelectasis, aspiration or pneumonia. Electronically Signed   By: Delbert Phenix M.D.   On: 11/07/2017 16:48   Dg Chest Portable 1 View  Result Date: 11/07/2017 CLINICAL DATA:  62 year old male with shortness of breath for 5 days. Nausea vomiting. EXAM: PORTABLE CHEST 1 VIEW COMPARISON:  [facility] portable chest x-ray 02/07/2015 and earlier. FINDINGS: Portable AP semi upright view at 1050 hours. Stable sequelae of median sternotomy. Cardiac and mediastinal contours are within normal limits. Visualized tracheal air column is within normal limits. Mixed veiling and confluent opacity in the right lung appears at least in part due to  loculated pleural effusion. Streaky opacity at the left lung base more resembling atelectasis. No superimposed pneumothorax. Pulmonary vascularity appears within normal limits. Negative visible bowel gas pattern. No acute osseous abnormality identified. IMPRESSION: 1. Abnormal right lung opacity, new since 2016, appears at least in part related to loculated pleural effusion. 2. Left lung base atelectasis or scarring suspected. Electronically Signed   By: Odessa Fleming M.D.   On: 11/07/2017 11:20   Ir Thoracentesis Asp Pleural Space W/img Guide  Result Date: 11/15/2017 INDICATION: Patient with right pleural effusion. Request is made for diagnostic and therapeutic thoracentesis. EXAM: ULTRASOUND GUIDED DIAGNOSTIC AND THERAPEUTIC RIGHT THORACENTESIS MEDICATIONS: 10 mL 2% lidocaine COMPLICATIONS: None immediate. PROCEDURE: An ultrasound guided thoracentesis was thoroughly discussed with the patient and questions answered. The benefits, risks, alternatives and complications were also discussed. The patient understands and wishes to proceed with the procedure. Written consent was obtained. Ultrasound was performed to localize and mark an adequate pocket of fluid in the right chest. The area was then prepped and draped in the normal sterile fashion. 2% lidocaine was used for local anesthesia. Under ultrasound guidance a 6 Fr Safe-T-Centesis catheter was introduced. Thoracentesis was performed. The catheter was removed and a dressing applied. FINDINGS: A total of approximately 300 mL of amber fluid was removed. Samples were sent to the laboratory as requested by the clinical team. IMPRESSION: Successful ultrasound guided diagnostic and therapeutic right thoracentesis yielding 300 mL of pleural fluid. Read by: Loyce Dys PA-C Electronically Signed   By: Jolaine Click M.D.   On: 11/15/2017 13:57       Subjective: Patient is feeling well, dyspnea has improved, continue with intermittent cough, no nausea or vomiting.    Discharge Exam: Vitals:   11/15/17 2049 11/16/17 0602  BP: 130/67 (!) 145/63  Pulse: 65 (!) 56  Resp: 19 18  Temp: 98.1 F (36.7 C) 97.8 F (36.6 C)  SpO2: 95% 96%   Vitals:   11/15/17 1311 11/15/17 2049  11/16/17 0500 11/16/17 0602  BP: (!) 135/51 130/67  (!) 145/63  Pulse:  65  (!) 56  Resp:  19  18  Temp:  98.1 F (36.7 C)  97.8 F (36.6 C)  TempSrc:      SpO2:  95%  96%  Weight:   78 kg   Height:        General: Not in pain or dyspnea  Neurology: Awake and alert, non focal  E ENT: no pallor, no icterus, oral mucosa moist Cardiovascular: No JVD. S1-S2 present, rhythmic, no gallops, rubs, or murmurs. No lower extremity edema. Pulmonary: positive breath sounds bilaterally, adequate air movement, no wheezing,or rhonchi, scattered rales on the right. Gastrointestinal. Abdomen with no organomegaly, non tender, no rebound or guarding Skin. No rashes Musculoskeletal: no joint deformities   The results of significant diagnostics from this hospitalization (including imaging, microbiology, ancillary and laboratory) are listed below for reference.     Microbiology: Recent Results (from the past 240 hour(s))  MRSA PCR Screening     Status: None   Collection Time: 11/07/17  1:41 PM  Result Value Ref Range Status   MRSA by PCR NEGATIVE NEGATIVE Final    Comment:        The GeneXpert MRSA Assay (FDA approved for NASAL specimens only), is one component of a comprehensive MRSA colonization surveillance program. It is not intended to diagnose MRSA infection nor to guide or monitor treatment for MRSA infections. Performed at [facility], [address] [place], [place] [address]   Gram stain     Status: None   Collection Time: 11/15/17  1:08 PM  Result Value Ref Range Status   Specimen Description PLEURAL RIGHT  Final   Special Requests NONE  Final   Gram Stain   Final    NO WBC SEEN NO ORGANISMS SEEN Performed at [facility], [address],  [place], [place] [address]    Report Status 11/15/2017 FINAL  Final     Labs: BNP (last 3 results) Recent Labs    11/07/17 0947 11/11/17 0450  BNP 129.3* 557.8*   Basic Metabolic Panel: Recent Labs  Lab 11/10/17 0303 11/11/17 0450 11/12/17 0459 11/13/17 0211 11/15/17 0452  NA 138 137 131* 132* 133*  K 3.4* 3.3* 4.1 4.5 4.2  CL 107 104 95* 97* 99  CO2 21* 25 25 26 26   GLUCOSE 111* 45* 240* 239* 223*  BUN 28* 17 16 15 11   CREATININE 0.85 0.73 0.85 0.98 0.74  CALCIUM 7.5* 7.8* 7.7* 7.9* 8.4*  MG  --   --  1.6* 1.8  --    Liver Function Tests: Recent Labs  Lab 11/15/17 0452  AST 42*  ALT 41  ALKPHOS 66  BILITOT 0.4  PROT 5.9*  ALBUMIN 2.3*   No results for input(s): LIPASE, AMYLASE in the last 168 hours. No results for input(s): AMMONIA in the last 168 hours. CBC: Recent Labs  Lab 11/10/17 0303 11/10/17 0845 11/11/17 0450 11/12/17 0459 11/13/17 0211 11/14/17 0530  WBC 12.3* DUPLICATE  REFER [id number] 11.9* 10.7* 11.8* 11.1*  NEUTROABS 8.7* PENDING  --   --   --   --   HGB 10.3* DUPLICATE  REFER [id number] 10.8* 11.2* 10.6* 10.6*  HCT 31.4* DUPLICATE  REFER [id number] 32.4* 34.3* 32.2* 32.2*  MCV 92.1 DUPLICATE  REFER [id number] 92.0 91.2 91.2 92.0  PLT 350 DUPLICATE  REFER [id number] 381 403* 455* 445*   Cardiac Enzymes: No results for input(s): CKTOTAL,  CKMB, CKMBINDEX, TROPONINI in the last 168 hours. BNP: Invalid input(s): POCBNP CBG: Recent Labs  Lab 11/14/17 2108 11/15/17 0804 11/15/17 1200 11/15/17 1708 11/15/17 2051  GLUCAP 221* 196* 351* 181* 218*   D-Dimer No results for input(s): DDIMER in the last 72 hours. Hgb A1c No results for input(s): HGBA1C in the last 72 hours. Lipid Profile No results for input(s): CHOL, HDL, LDLCALC, TRIG, CHOLHDL, LDLDIRECT in the last 72 hours. Thyroid function studies No results for input(s): TSH, T4TOTAL, T3FREE, THYROIDAB in the last 72 hours.  Invalid input(s): FREET3 Anemia work up No results for input(s): VITAMINB12,  FOLATE, FERRITIN, TIBC, IRON, RETICCTPCT in the last 72 hours. Urinalysis    Component Value Date/Time   COLORURINE YELLOW 11/07/2017 1845   APPEARANCEUR CLEAR 11/07/2017 1845   LABSPEC 1.021 11/07/2017 1845   PHURINE 6.0 11/07/2017 1845   GLUCOSEU >=500 (A) 11/07/2017 1845   HGBUR NEGATIVE 11/07/2017 1845   BILIRUBINUR NEGATIVE 11/07/2017 1845   KETONESUR 80 (A) 11/07/2017 1845   PROTEINUR NEGATIVE 11/07/2017 1845   NITRITE NEGATIVE 11/07/2017 1845   LEUKOCYTESUR NEGATIVE 11/07/2017 1845   Sepsis Labs Invalid input(s): PROCALCITONIN,  WBC,  LACTICIDVEN Microbiology Recent Results (from the past 240 hour(s))  MRSA PCR Screening     Status: None   Collection Time: 11/07/17  1:41 PM  Result Value Ref Range Status   MRSA by PCR NEGATIVE NEGATIVE Final    Comment:        The GeneXpert MRSA Assay (FDA approved for NASAL specimens only), is one component of a comprehensive MRSA colonization surveillance program. It is not intended to diagnose MRSA infection nor to guide or monitor treatment for MRSA infections. Performed at [facility], [address] [place], [place] [address]   Gram stain     Status: None   Collection Time: 11/15/17  1:08 PM  Result Value Ref Range Status   Specimen Description PLEURAL RIGHT  Final   Special Requests NONE  Final   Gram Stain   Final    NO WBC SEEN NO ORGANISMS SEEN Performed at [facility], [address] [place], [place] [address]    Report Status 11/15/2017 FINAL  Final     Time coordinating discharge: 45 minutes  SIGNED:   Coralie Keens, MD  [facility] 11/16/2017, 8:06 AM Pager [phone]  If 7PM-7AM, please contact night-coverage [email or web address] Password [email or web address]

## 2017-11-16 NOTE — Progress Notes (Signed)
Nsg Discharge Note  Admit Date:  11/07/2017 Discharge date: 11/16/2017   Brian Miller to be D/C'd Home per MD order.  AVS completed.  Copy for chart, and copy for patient signed, and dated. Patient/caregiver able to verbalize understanding.  Discharge Medication: Allergies as of 11/16/2017   No Known Allergies     Medication List    TAKE these medications   aspirin 81 MG chewable tablet Chew 81 mg by mouth daily. What changed:  Another medication with the same name was removed. Continue taking this medication, and follow the directions you see here. Notes to patient:  Take medication with Meals    clopidogrel 75 MG tablet Commonly known as:  PLAVIX Take 1 tablet (75 mg total) by mouth daily.   empagliflozin 25 MG Tabs tablet Commonly known as:  JARDIANCE Take 25 mg by mouth daily.   glimepiride 4 MG tablet Commonly known as:  AMARYL Take 2 mg by mouth 2 (two) times daily.   guaiFENesin-dextromethorphan 100-10 MG/5ML syrup Commonly known as:  ROBITUSSIN DM Take 5 mLs by mouth every 6 (six) hours as needed for cough.   insulin glargine 100 UNIT/ML injection Commonly known as:  LANTUS Inject 80 Units into the skin daily. Notes to patient:  Take does as needed at home    losartan-hydrochlorothiazide 100-25 MG tablet Commonly known as:  HYZAAR Take 1 tablet by mouth daily.   metFORMIN 1000 MG tablet Commonly known as:  GLUCOPHAGE Take 1,000 mg by mouth 2 (two) times daily with a meal.   metoprolol succinate 25 MG 24 hr tablet Commonly known as:  TOPROL-XL Take 25 mg by mouth daily.   rosuvastatin 20 MG tablet Commonly known as:  CRESTOR Take 20 mg by mouth daily.       Discharge Assessment: Vitals:   11/16/17 0602 11/16/17 0940  BP: (!) 145/63   Pulse: (!) 56 65  Resp: 18   Temp: 97.8 F (36.6 C)   SpO2: 96%    Skin clean, dry and intact without evidence of skin break down, no evidence of skin tears noted. IV catheter discontinued intact. Site without  signs and symptoms of complications - no redness or edema noted at insertion site, patient denies c/o pain - only slight tenderness at site.  Dressing with slight pressure applied.  D/c Instructions-Education: Discharge instructions given to patient/family with verbalized understanding. D/c education completed with patient/family including follow up instructions, medication list, d/c activities limitations if indicated, with other d/c instructions as indicated by MD - patient able to verbalize understanding, all questions fully answered. Patient instructed to return to ED, call 911, or call MD for any changes in condition.  Patient escorted via WC, and D/C home via private auto.  [place] N Sidharth Leverette, RN 11/16/2017 12:36 PM

## 2017-11-16 NOTE — Progress Notes (Signed)
Hypoglycemic Event  CBG: 62  Treatment: 15 GM carbohydrate snack  Symptoms: None  Follow-up CBG: Time:0930 CBG Result:187  Possible Reasons for Event: Unknown  Comments/MD notified: Held metformin    [place] N Ardine Iacovelli

## 2017-11-16 NOTE — Progress Notes (Signed)
Informed Dr. Ella Jubilee that patients CBG is 62 this morning. Per MD hold metformin.

## 2017-11-16 NOTE — Progress Notes (Signed)
Informed MD that patients CBG is 187. Per MD hold lantus at this time.

## 2017-11-17 LAB — PH, BODY FLUID: pH, Body Fluid: 7.6

## 2017-11-20 LAB — CULTURE, BODY FLUID-BOTTLE

## 2017-11-20 LAB — CULTURE, BODY FLUID W GRAM STAIN -BOTTLE: Culture: NO GROWTH

## 2017-11-21 LAB — PATHOLOGIST SMEAR REVIEW

## 2017-12-29 LAB — ACID FAST CULTURE WITH REFLEXED SENSITIVITIES (MYCOBACTERIA): Acid Fast Culture: NEGATIVE

## 2017-12-29 LAB — ACID FAST CULTURE WITH REFLEXED SENSITIVITIES

## 2020-04-05 ENCOUNTER — Emergency Department (HOSPITAL_BASED_OUTPATIENT_CLINIC_OR_DEPARTMENT_OTHER)
Admission: EM | Admit: 2020-04-05 | Discharge: 2020-04-05 | Disposition: A | Discharge status: Home / Self Care | Payer: BC Managed Care – PPO | Attending: Emergency Medicine | Admitting: Emergency Medicine

## 2020-04-05 ENCOUNTER — Encounter (HOSPITAL_BASED_OUTPATIENT_CLINIC_OR_DEPARTMENT_OTHER): Payer: Self-pay

## 2020-04-05 ENCOUNTER — Other Ambulatory Visit: Payer: Self-pay

## 2020-04-05 DIAGNOSIS — E1121 Type 2 diabetes mellitus with diabetic nephropathy: Secondary | ICD-10-CM | POA: Insufficient documentation

## 2020-04-05 DIAGNOSIS — Z7982 Long term (current) use of aspirin: Secondary | ICD-10-CM | POA: Insufficient documentation

## 2020-04-05 DIAGNOSIS — X58XXXA Exposure to other specified factors, initial encounter: Secondary | ICD-10-CM | POA: Diagnosis not present

## 2020-04-05 DIAGNOSIS — E1110 Type 2 diabetes mellitus with ketoacidosis without coma: Secondary | ICD-10-CM | POA: Insufficient documentation

## 2020-04-05 DIAGNOSIS — Y990 Civilian activity done for income or pay: Secondary | ICD-10-CM | POA: Insufficient documentation

## 2020-04-05 DIAGNOSIS — I2510 Atherosclerotic heart disease of native coronary artery without angina pectoris: Secondary | ICD-10-CM | POA: Diagnosis not present

## 2020-04-05 DIAGNOSIS — S46812A Strain of other muscles, fascia and tendons at shoulder and upper arm level, left arm, initial encounter: Secondary | ICD-10-CM | POA: Diagnosis not present

## 2020-04-05 DIAGNOSIS — Z79899 Other long term (current) drug therapy: Secondary | ICD-10-CM | POA: Diagnosis not present

## 2020-04-05 DIAGNOSIS — I110 Hypertensive heart disease with heart failure: Secondary | ICD-10-CM | POA: Insufficient documentation

## 2020-04-05 DIAGNOSIS — Z794 Long term (current) use of insulin: Secondary | ICD-10-CM | POA: Insufficient documentation

## 2020-04-05 DIAGNOSIS — E1142 Type 2 diabetes mellitus with diabetic polyneuropathy: Secondary | ICD-10-CM | POA: Diagnosis not present

## 2020-04-05 DIAGNOSIS — Z7984 Long term (current) use of oral hypoglycemic drugs: Secondary | ICD-10-CM | POA: Insufficient documentation

## 2020-04-05 DIAGNOSIS — S4992XA Unspecified injury of left shoulder and upper arm, initial encounter: Secondary | ICD-10-CM | POA: Diagnosis present

## 2020-04-05 DIAGNOSIS — I509 Heart failure, unspecified: Secondary | ICD-10-CM | POA: Diagnosis not present

## 2020-04-05 MED ORDER — DICLOFENAC SODIUM 1 % EX GEL: 2.0000 g | Freq: Four times a day (QID) | CUTANEOUS | 0 refills | Status: AC

## 2020-04-05 MED ORDER — METHOCARBAMOL 500 MG PO TABS: 500.0000 mg | ORAL_TABLET | Freq: Two times a day (BID) | ORAL | 0 refills | Status: AC | PRN

## 2020-04-05 NOTE — ED Triage Notes (Signed)
Pt arrives ambulatory to ED with c/o pain to back of neck. Pt states he has been having pain X1 week. Denies injury.

## 2020-04-05 NOTE — Discharge Instructions (Addendum)
Your history of physical exam is consistent with a left-sided trapezial strain.  Please take Tylenol 500 mg every 6 hours as needed for pain symptoms.  You were given a prescription for Robaxin which is a muscle relaxer.  You should not drive, work, consume alcohol, or operate machinery while taking this medication as it can make you very drowsy.  You can also use the topical Voltaren gel for additional relief.  I recommend heating pads to the affected area and light stretching exercises.  Please follow-up with your primary care provider tomorrow, as scheduled.

## 2020-04-05 NOTE — ED Provider Notes (Signed)
[facility] EMERGENCY DEPARTMENT Provider Note   CSN: [id number] Arrival date & time: 04/05/20  1317     History Chief Complaint  Patient presents with  . Neck Pain    Brian Miller is a 65 y.o. male with PMH of HTN, PAD, CHF, type II DM, and CAD with MI on Xarelto who presents to the ED with a 1 week history of atraumatic neck pain.  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  He states that for the past 10 days he has been experiencing left-sided neck discomfort.  He went to the company nurse who suspected that this could be a muscle spasm.  He has been taking Tylenol, without any significant relief.  He has also been applying topical icy hot.  He has an appointment with his primary care provider tomorrow, but given his ongoing discomfort, had to miss work today and came to the ED for evaluation.  Patient denies any obvious precipitating trauma or injury.  He states that is worse when turning his head to the left and with going to bed at night.  He denies any headache, blurred vision, numbness or weakness, left shoulder/arm discomfort, overlying skin changes, recent illness or infection, fevers or chills, or any other new or worsening symptoms.  HPI     Past Medical History:  Diagnosis Date  . CHF (congestive heart failure) (HCC)   . Coronary artery disease   . Diabetic nephropathy (HCC)   . Diabetic neuropathy (HCC)   . DKA (diabetic ketoacidoses) 10/2017   hx/notes 11/12/2017  . Essential hypertension   . Myocardial infarction (HCC) 11/07/2017   Hattie Perch 11/12/2017  . Pneumonia    "several times" (11/12/2017)  . Type II diabetes mellitus [facility])     Patient Active Problem List   Diagnosis Date Noted  . Acute coronary syndrome (HCC) 11/10/2017  . DKA (diabetic ketoacidoses) 11/07/2017  . Acute respiratory failure (HCC) 11/07/2017    Past Surgical History:  Procedure Laterality Date  . APPENDECTOMY    . BACK  SURGERY    . CARDIAC CATHETERIZATION  11/12/2017  . CORONARY ANGIOPLASTY WITH STENT PLACEMENT  2015   Native circumflex was stented Hattie Perch 11/12/2017  . CORONARY ARTERY BYPASS GRAFT  2002    LIMA to LAD, SVG to circumflex/notes 11/12/2017  . IR THORACENTESIS ASP PLEURAL SPACE W/IMG GUIDE  11/15/2017  . KNEE ARTHROSCOPY Bilateral   . LEFT HEART CATH AND CORS/GRAFTS ANGIOGRAPHY N/A 11/12/2017   Procedure: LEFT HEART CATH AND CORS/GRAFTS ANGIOGRAPHY;  Surgeon: [place], Peter M, MD;  Location: [facility] INVASIVE CV LAB;  Service: Cardiovascular;  Laterality: N/A;  . LUMBAR DISC SURGERY    . TOE AMPUTATION Right 01/2015   2nd digit       No family history on file.  Social History   Tobacco Use  . Smoking status: Never Smoker  . Smokeless tobacco: Former Neurosurgeon    Types: Engineer, drilling  . Vaping Use: Never used  Substance Use Topics  . Alcohol use: Not Currently  . Drug use: Never    Home Medications Prior to Admission medications   Medication Sig Start Date End Date Taking? Authorizing Provider  diclofenac Sodium (VOLTAREN) 1 % GEL Apply 2 g topically 4 (four) times daily. 04/05/20  Yes Lorelee New, PA-C  methocarbamol (ROBAXIN) 500 MG tablet Take 1 tablet (500 mg total) by mouth 2 (two) times daily as needed for muscle spasms. 04/05/20  Yes  Lorelee New, PA-C  aspirin 81 MG chewable tablet Chew 81 mg by mouth daily.    [provider]  empagliflozin (JARDIANCE) 25 MG TABS tablet Take 25 mg by mouth daily. 05/04/16   [provider]  glimepiride (AMARYL) 4 MG tablet Take 2 mg by mouth 2 (two) times daily. 10/19/17   [provider]  guaiFENesin-dextromethorphan (ROBITUSSIN DM) 100-10 MG/5ML syrup Take 5 mLs by mouth every 6 (six) hours as needed for cough. 11/16/17   Arrien, York Ram, MD  insulin glargine (LANTUS) 100 UNIT/ML injection Inject 80 Units into the skin daily.    [provider]  losartan-hydrochlorothiazide (HYZAAR) 100-25 MG tablet  Take 1 tablet by mouth daily. 04/28/15   [provider]  metFORMIN (GLUCOPHAGE) 1000 MG tablet Take 1,000 mg by mouth 2 (two) times daily with a meal.    [provider]  metoprolol succinate (TOPROL-XL) 25 MG 24 hr tablet Take 25 mg by mouth daily.    [provider]  rosuvastatin (CRESTOR) 20 MG tablet Take 20 mg by mouth daily. 10/19/17   [provider]    Allergies    Patient has no known allergies.  Review of Systems   Review of Systems  All other systems reviewed and are negative.   Physical Exam Updated Vital Signs BP (!) 162/68 (BP Location: Left Arm)   Pulse 62   Temp 98.2 F (36.8 C) (Oral)   Resp 18   Ht 5\' 10"  (1.778 m)   Wt 88.5 kg   SpO2 99%   BMI 27.98 kg/m   Physical Exam Vitals and nursing note reviewed. Exam conducted with a chaperone present.  Constitutional:      General: He is not in acute distress.    Appearance: Normal appearance. He is not ill-appearing.  HENT:     Head: Normocephalic and atraumatic.  Eyes:     General: No scleral icterus.    Conjunctiva/sclera: Conjunctivae normal.  Neck:     Comments: ROM intact, but limited due to discomfort.  Worse when looking in ipsilateral direction.  Left-sided trapezial tenderness reproducible with palpation.  No meningismus.  No overlying skin changes.  No midline cervical tenderness to palpation. Cardiovascular:     Rate and Rhythm: Normal rate and regular rhythm.     Pulses: Normal pulses.     Heart sounds: Normal heart sounds.  Pulmonary:     Effort: Pulmonary effort is normal. No respiratory distress.     Breath sounds: Normal breath sounds. No wheezing or rales.  Musculoskeletal:        General: Normal range of motion.     Cervical back: Tenderness present. No rigidity.     Comments: Left arm: ROM and strength fully intact.  Left-sided trapezial tenderness to palpation.  No midline spinal TTP.  Skin:    General: Skin is dry.  Neurological:     General: No  focal deficit present.     Mental Status: He is alert and oriented to person, place, and time.     GCS: GCS eye subscore is 4. GCS verbal subscore is 5. GCS motor subscore is 6.     Cranial Nerves: No cranial nerve deficit.     Sensory: No sensory deficit.     Motor: No weakness.     Coordination: Coordination normal.     Gait: Gait normal.  Psychiatric:        Mood and Affect: Mood normal.        Behavior:  Behavior normal.        Thought Content: Thought content normal.     ED Results / Procedures / Treatments   Labs (all labs ordered are listed, but only abnormal results are displayed) Labs Reviewed - No data to display  EKG None  Radiology No results found.  Procedures Procedures   Medications Ordered in ED Medications - No data to display  ED Course  I have reviewed the triage vital signs and the nursing notes.  Pertinent labs & imaging results that were available during my care of the patient were reviewed by me and considered in my medical decision making (see chart for details).    MDM Rules/Calculators/A&P                          Brian Miller was evaluated in Emergency Department on 04/05/2020 for the symptoms described in the history of present illness. He was evaluated in the context of the global COVID-19 pandemic, which necessitated consideration that the patient might be at risk for infection with the SARS-CoV-2 virus that causes COVID-19. Institutional protocols and algorithms that pertain to the evaluation of patients at risk for COVID-19 are in a state of rapid change based on information released by regulatory bodies including the CDC and federal and state organizations. These policies and algorithms were followed during the patient's care in the ED.  I personally reviewed patient's medical chart and all notes from triage and staff during today's encounter. I have also ordered and reviewed all labs and imaging that I felt to be medically necessary in the  evaluation of this patient's complaints and with consideration of their physical exam. If needed, translation services were available and utilized.   Patient's history and physical exam is suggestive of left-sided trapezial strain.  Will treat with Tylenol 500 mg every 6 hours and discharge him home with a course of Robaxin muscle relaxants.  Also prescribe Voltaren gel for topical relief and encouraged heating pad.  He cannot take regular NSAIDs given that he is on Xarelto.  He denies any chest discomfort, shortness of breath, diaphoresis, neuro symptoms, headache, or blurred vision.  Low suspicion for ACS given that his neck discomfort is readily reproducible on exam with turning head in ipsilateral direction and with palpation.  Cannot treat here in the ED as he is driving home.  Low suspicion for GCA/temporal arteritis given lack of headache or blurred vision.  No neuro deficits.  No meningismus.  Left arm ROM fully intact.  He will follow up with his primary care provider regarding today's encounter for ongoing evaluation and management.  ED return precautions discussed.  Patient voices understanding and is agreeable to plan.   Final Clinical Impression(s) / ED Diagnoses Final diagnoses:  Trapezius strain, left, initial encounter    Rx / DC Orders ED Discharge Orders         Ordered    methocarbamol (ROBAXIN) 500 MG tablet  2 times daily PRN        04/05/20 1446    diclofenac Sodium (VOLTAREN) 1 % GEL  4 times daily        04/05/20 1446           Elvera Maria 04/05/20 1446    Terrilee Files, MD 04/05/20 1728

## 2020-05-21 IMAGING — DX DG CHEST 1V PORT
1 series · 1 of 1 positions shown · non-contrast
Comparison: 11/07/2017

CLINICAL DATA: Respiratory failure

EXAM:
PORTABLE CHEST 1 VIEW

[chest ap]
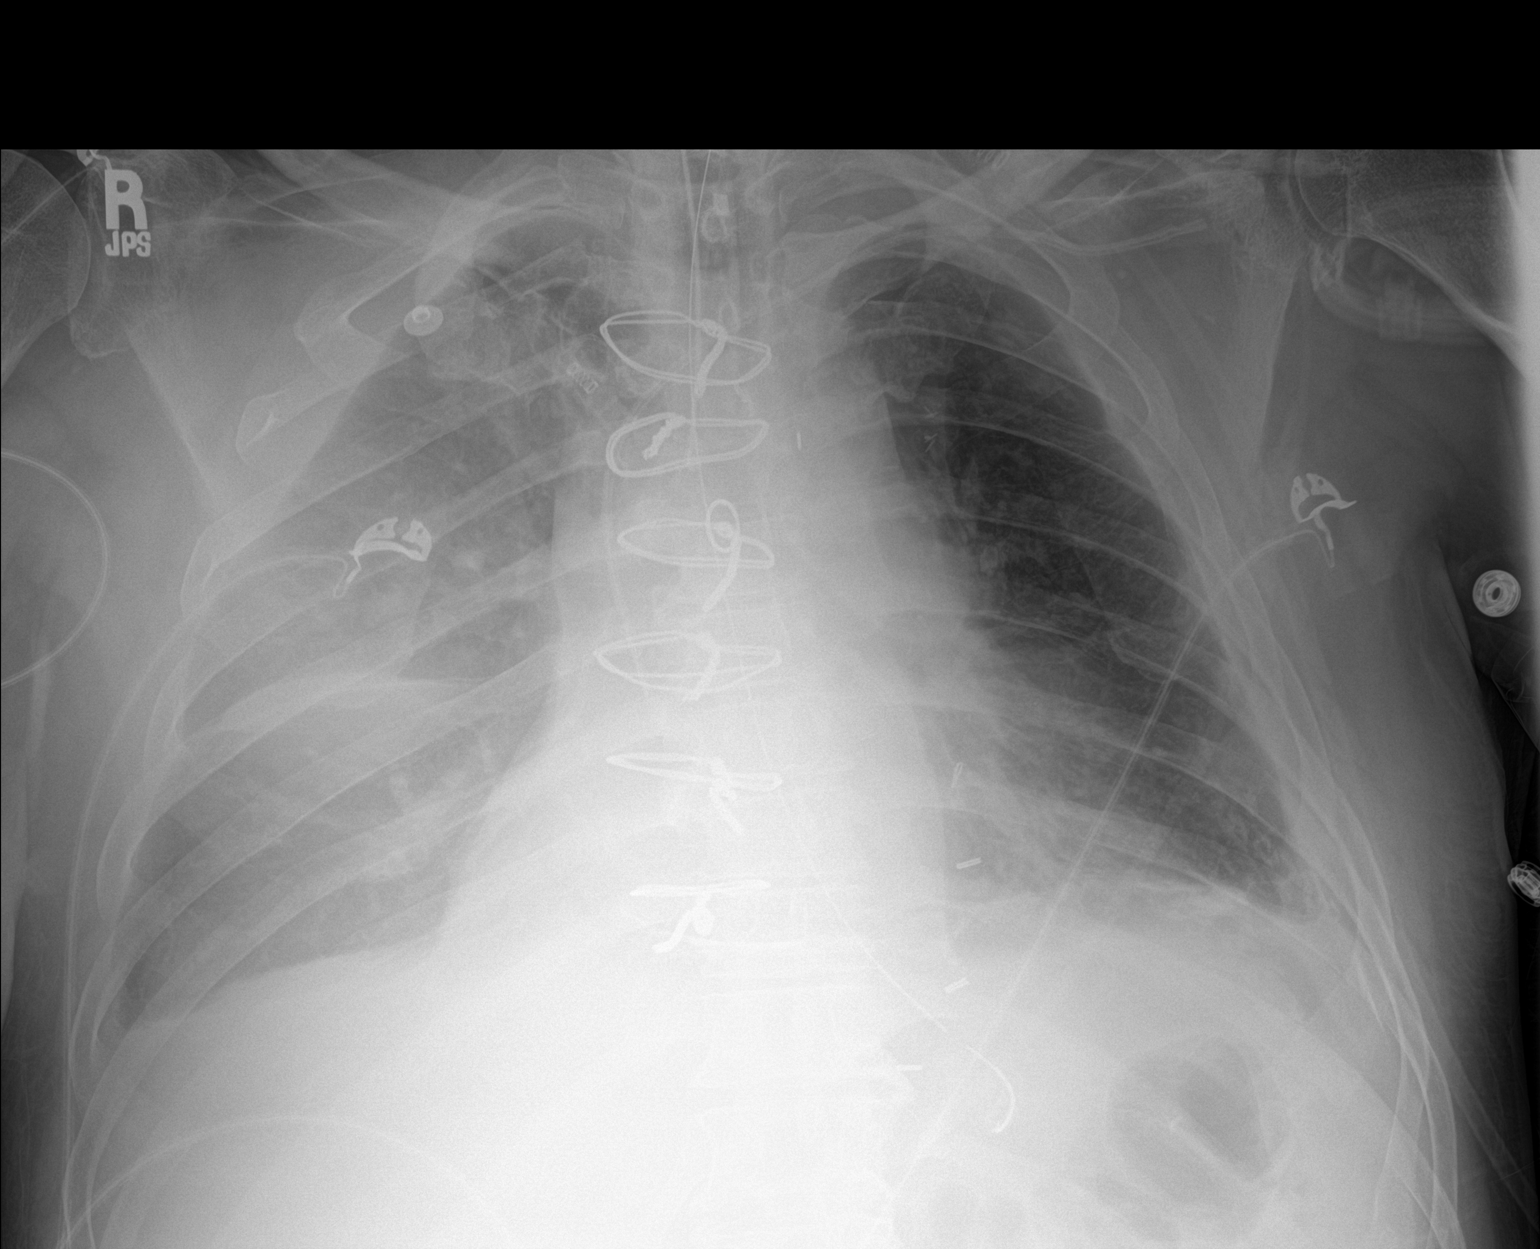

[1 of 1 positions shown; findings below may reference images not displayed]

FINDINGS: Cardiac shadow is enlarged but stable. Left subclavian central line
is noted in satisfactory position. Endotracheal tube and nasogastric
catheter are again seen in satisfactory position. Right-sided
pleural effusion is again noted but appears slightly improved
although this may be positional in nature. Small left effusion is
noted as well. No focal confluent infiltrate is seen.
IMPRESSION: Bilateral pleural effusions right greater than left. No focal
infiltrate is seen.

Tubes and lines as described.

## 2020-05-28 IMAGING — DX DG CHEST 1V
1 series · 1 of 1 positions shown · non-contrast
Comparison: Chest CT, 11/13/2017.  Chest radiographs, 11/11/2017.

CLINICAL DATA: Status post thoracentesis.

EXAM:
CHEST  1 VIEW

[chest ap]
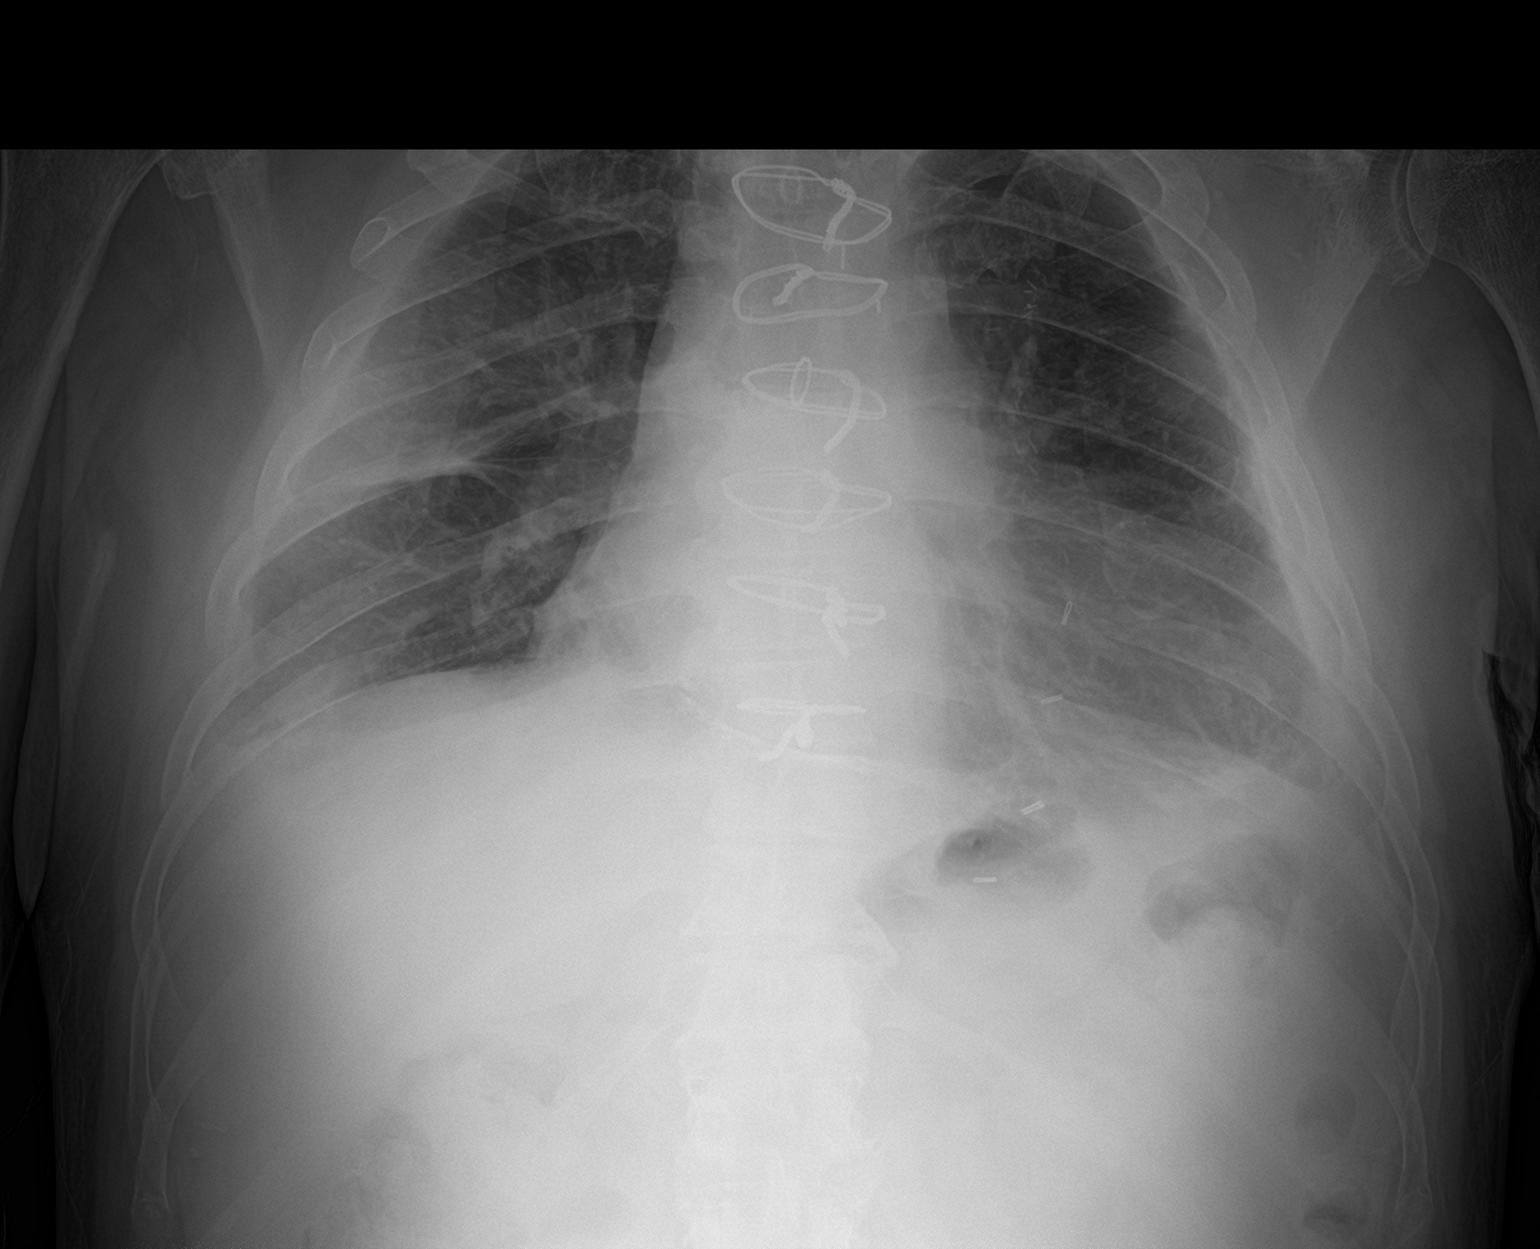

[1 of 1 positions shown; findings below may reference images not displayed]

FINDINGS: There is decreased pleural fluid on the right following
thoracentesis. No pneumothorax.

Mild hazy opacity is noted in the right upper lobe adjacent to the
minor fissure. There is high basilar lung opacity consistent with
small residual effusions with atelectasis. Right upper lobe opacity
is likely a small amount of fluid in the minor fissure with
atelectasis.

Remainder of the lungs is clear.  No pulmonary edema.

Stable changes from prior CABG surgery.
IMPRESSION: 1. Significant decrease in right pleural fluid following
thoracentesis.
2. No pneumothorax.

## 2020-05-28 IMAGING — US IR THORACENTESIS ASP PLEURAL SPACE W/IMG GUIDE
1 series · 3 of 3 positions shown · non-contrast
Comparison: none

INDICATION: Patient with right pleural effusion. Request is made for diagnostic
and therapeutic thoracentesis.

[Series 1: ir (id) (id)/(id)/(id) ir · 3 of 3 slices shown]
[im 1/3]
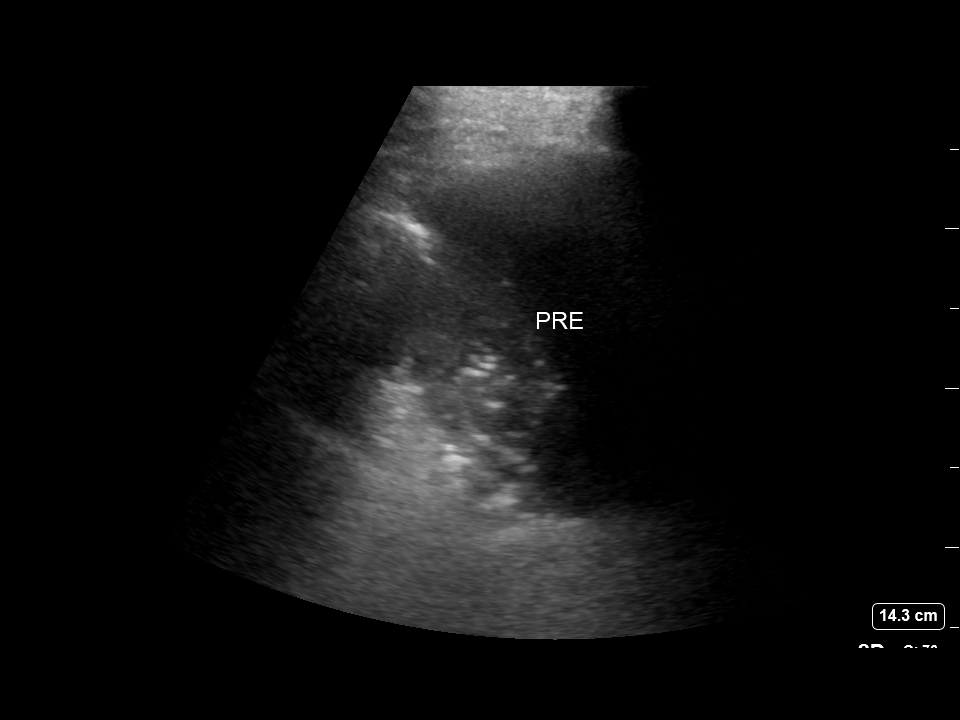
[im 2/3]
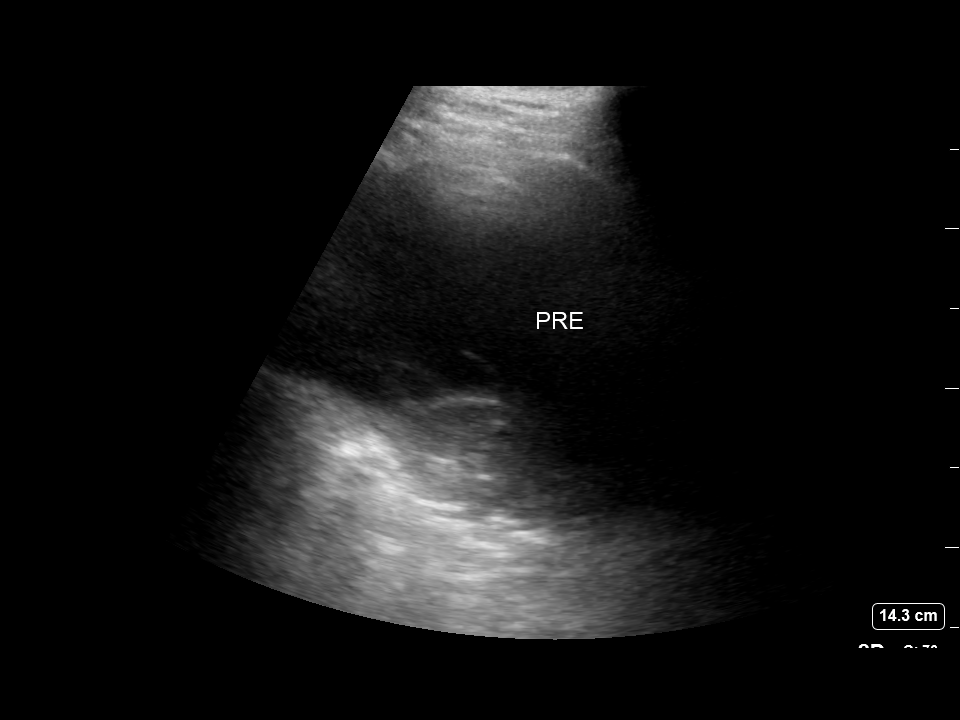
[im 3/3]
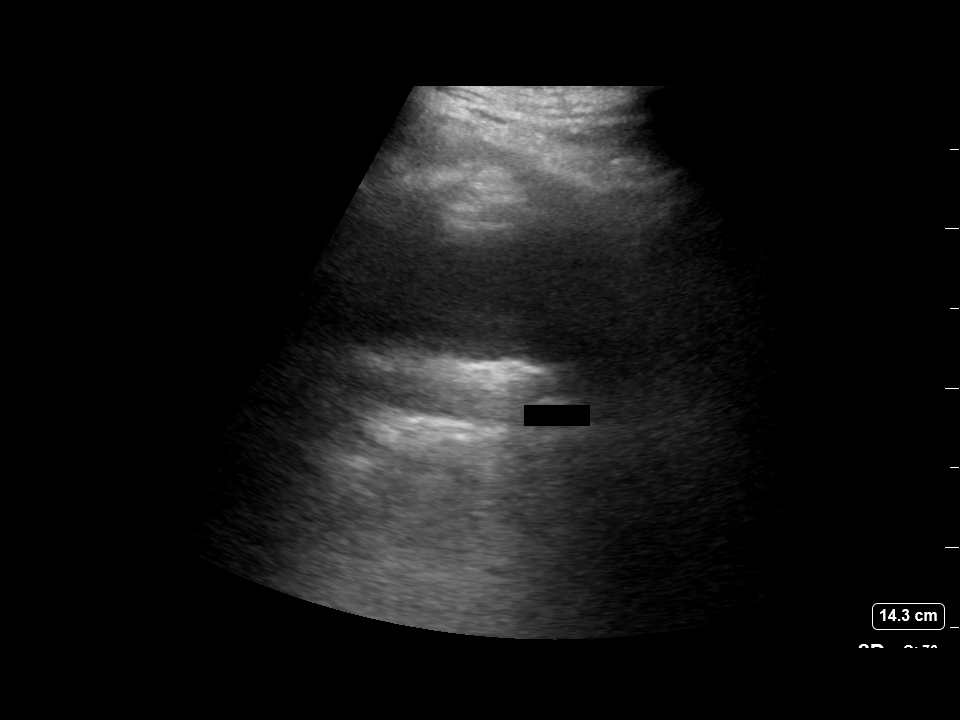

[3 of 3 positions shown; findings below may reference images not displayed]

EXAM:
ULTRASOUND GUIDED DIAGNOSTIC AND THERAPEUTIC RIGHT THORACENTESIS

MEDICATIONS:
10 mL 2% lidocaine

COMPLICATIONS:
None immediate.

PROCEDURE:
An ultrasound guided thoracentesis was thoroughly discussed with the
patient and questions answered. The benefits, risks, alternatives
and complications were also discussed. The patient understands and
wishes to proceed with the procedure. Written consent was obtained.

Ultrasound was performed to localize and mark an adequate pocket of
fluid in the right chest. The area was then prepped and draped in
the normal sterile fashion. 2% lidocaine was used for local
anesthesia. Under ultrasound guidance a 6 Fr Safe-T-Centesis
catheter was introduced. Thoracentesis was performed. The catheter
was removed and a dressing applied.
FINDINGS: A total of approximately 300 mL of amber fluid was removed. Samples
were sent to the laboratory as requested by the clinical team.
IMPRESSION: Successful ultrasound guided diagnostic and therapeutic right
thoracentesis yielding 300 mL of pleural fluid.

## 2020-09-08 ENCOUNTER — Other Ambulatory Visit: Payer: Self-pay | Admitting: Student

## 2020-09-08 DIAGNOSIS — E08621 Diabetes mellitus due to underlying condition with foot ulcer: Secondary | ICD-10-CM

## 2020-09-08 DIAGNOSIS — L97519 Non-pressure chronic ulcer of other part of right foot with unspecified severity: Secondary | ICD-10-CM

## 2022-11-20 DEATH — deceased
